# Patient Record
Sex: Male | Born: 1965 | Race: White | Hispanic: No | Smoking: Never smoker
Health system: Southern US, Community
[De-identification: ages and names within clinical notes are randomized; demographics above are authoritative.]

## PROBLEM LIST (undated history)

## (undated) DIAGNOSIS — Z87442 Personal history of urinary calculi: Secondary | ICD-10-CM

## (undated) DIAGNOSIS — N529 Male erectile dysfunction, unspecified: Secondary | ICD-10-CM

## (undated) DIAGNOSIS — N400 Enlarged prostate without lower urinary tract symptoms: Secondary | ICD-10-CM

## (undated) DIAGNOSIS — K4090 Unilateral inguinal hernia, without obstruction or gangrene, not specified as recurrent: Secondary | ICD-10-CM

## (undated) DIAGNOSIS — E785 Hyperlipidemia, unspecified: Secondary | ICD-10-CM

## (undated) DIAGNOSIS — R972 Elevated prostate specific antigen [PSA]: Secondary | ICD-10-CM

## (undated) DIAGNOSIS — I10 Essential (primary) hypertension: Secondary | ICD-10-CM

## (undated) HISTORY — DX: Benign prostatic hyperplasia without lower urinary tract symptoms: N40.0

## (undated) HISTORY — DX: Hyperlipidemia, unspecified: E78.5

## (undated) HISTORY — PX: OTHER SURGICAL HISTORY: SHX169

## (undated) HISTORY — DX: Male erectile dysfunction, unspecified: N52.9

## (undated) HISTORY — DX: Elevated prostate specific antigen (PSA): R97.20

## (undated) HISTORY — DX: Essential (primary) hypertension: I10

---

## 2000-06-03 DIAGNOSIS — Z9889 Other specified postprocedural states: Secondary | ICD-10-CM

## 2000-06-03 DIAGNOSIS — R112 Nausea with vomiting, unspecified: Secondary | ICD-10-CM

## 2000-06-03 HISTORY — DX: Nausea with vomiting, unspecified: R11.2

## 2000-06-03 HISTORY — DX: Other specified postprocedural states: Z98.890

## 2000-06-03 HISTORY — PX: HERNIA REPAIR: SHX51

## 2008-09-04 ENCOUNTER — Emergency Department: Payer: Self-pay | Admitting: Emergency Medicine

## 2008-09-04 ENCOUNTER — Observation Stay: Payer: Self-pay | Admitting: Internal Medicine

## 2008-09-05 ENCOUNTER — Ambulatory Visit: Payer: Self-pay | Admitting: Cardiology

## 2009-02-15 ENCOUNTER — Ambulatory Visit: Payer: Self-pay | Admitting: Family Medicine

## 2009-12-15 ENCOUNTER — Ambulatory Visit: Payer: Self-pay | Admitting: Unknown Physician Specialty

## 2014-01-14 ENCOUNTER — Ambulatory Visit: Payer: Self-pay | Admitting: Podiatry

## 2014-11-09 DIAGNOSIS — E7800 Pure hypercholesterolemia, unspecified: Secondary | ICD-10-CM | POA: Insufficient documentation

## 2014-11-09 DIAGNOSIS — N529 Male erectile dysfunction, unspecified: Secondary | ICD-10-CM | POA: Insufficient documentation

## 2014-11-09 DIAGNOSIS — I10 Essential (primary) hypertension: Secondary | ICD-10-CM | POA: Insufficient documentation

## 2015-05-11 ENCOUNTER — Ambulatory Visit (INDEPENDENT_AMBULATORY_CARE_PROVIDER_SITE_OTHER): Payer: Managed Care, Other (non HMO) | Admitting: Podiatry

## 2015-05-11 ENCOUNTER — Encounter: Payer: Self-pay | Admitting: Podiatry

## 2015-05-11 ENCOUNTER — Ambulatory Visit (INDEPENDENT_AMBULATORY_CARE_PROVIDER_SITE_OTHER): Payer: Managed Care, Other (non HMO)

## 2015-05-11 VITALS — BP 146/85 | HR 62 | Resp 18

## 2015-05-11 DIAGNOSIS — M79672 Pain in left foot: Secondary | ICD-10-CM | POA: Diagnosis not present

## 2015-05-11 DIAGNOSIS — M8430XA Stress fracture, unspecified site, initial encounter for fracture: Secondary | ICD-10-CM

## 2015-05-11 DIAGNOSIS — M722 Plantar fascial fibromatosis: Secondary | ICD-10-CM | POA: Diagnosis not present

## 2015-05-11 MED ORDER — TRAMADOL HCL 50 MG PO TABS: 50.0000 mg | ORAL_TABLET | Freq: Three times a day (TID) | ORAL | Status: DC | PRN

## 2015-05-11 NOTE — Progress Notes (Signed)
   Subjective:    Patient ID: Jeff Rice, male    DOB: [birth year], 49 y.o.   MRN: [id number]  HPI  49 year old male presents the also concerned of sudden onset left heel pain which started on Monday morning. He states the areas sore and tender to the pressure in his heel and even when he sits down without putting weight down. He has had some swelling to his heel but and no redness. No recent injury or trauma. He is not increase her changes activity recently. He does walk/run on a treadmill. No tenderness. No other complaints at this time.  Review of Systems  All other systems reviewed and are negative.      Objective:   Physical Exam General: AAO x3, NAD  Dermatological: Skin is warm, dry and supple bilateral. Nails x 10 are well manicured; remaining integument appears unremarkable at this time. There are no open sores, no preulcerative lesions, no rash or signs of infection present.  Vascular: Dorsalis Pedis artery and Posterior Tibial artery pedal pulses are 2/4 bilateral with immedate capillary fill time. Pedal hair growth present. No varicosities and no lower extremity edema present bilateral. There is no pain with calf compression, swelling, warmth, erythema.   Neruologic: Grossly intact via light touch bilateral. Vibratory intact via tuning fork bilateral. Protective threshold with Semmes Wienstein monofilament intact to all pedal sites bilateral. Patellar and Achilles deep tendon reflexes 2+ bilateral. No Babinski or clonus noted bilateral.   Musculoskeletal: There is tenderness palpation along the posterior, lateral aspect of the left calcaneus. There is very mild discomfort on the plantar aspect of the calcaneus or the insertion of the plantar fascia and along the course of plantar fascia. The plantar fascia appears to be intact. There is no pain on the course last insertion of the Achilles tendon intact and has a negative. There does appear to be pain with lateral compression of  the calcaneus. No other areas tenderness bilateral lower chemise.  Muscular strength 5/5 in all groups tested bilateral.  Gait: Unassisted, Nonantalgic.       Assessment & Plan:  49 year old male left calcaneal stress fracture -Treatment options discussed including all alternatives, risks, and complications -X-rays were obtained and reviewed with the patient. There is a sclerotic line through the calcaneal tubercle concerning for stress fracture. -Due to x-ray changes as well as clinical findings recommend immobilization in a CAM boot. I also gave a prescription for crutches. Dispensed CAM boot. -Ice and elevation -He has been taken anti-inflammatories without any relief. Prescribed tramadol. -Hold off on any exercising. -Follow-up in 2-3 weeks or sooner if any problems arise. In the meantime, encouraged to call the office with any questions, concerns, change in symptoms.   Celesta Gentile, DPM

## 2015-05-11 NOTE — Patient Instructions (Signed)
Stress Fracture Stress fracture is a small break or crack in a bone. A stress fracture can be fully broken (complete) or partially broken (incomplete). The most common sites for stress fractures are the bones in the front of your feet (metatarsals), your heels (calcaneus), and the long bone of your lower leg (tibia). CAUSES A stress fracture is caused by overuse or repetitive exercise, such as running. It happens when a bone cannot absorb any more shock because the muscles around it are weak. Stress fractures happen most commonly when:  You rapidly increase or start a new physical activity.  You use shoes that are worn out or do not fit you properly.  You exercise on a new surface. RISK FACTORS You may be at higher risk for this type of fracture if:  You have a condition that causes weak bones (osteoporosis).  You are male. Stress fractures are more likely to occur in women. SIGNS AND SYMPTOMS The most common symptom of a stress fracture is feeling pain when you are using the affected part of your body. The pain usually goes away when you are resting. Other symptoms may include:  Swelling of the affected area.  Pain in the area when it is touched.  Decreased pain while resting. Stress fracture pain usually develops over time. DIAGNOSIS Diagnosis may include:   Medical history and physical exam.  X-rays.  Bone scan.  MRI. TREATMENT Treatment depends on the severity of your stress fracture. Treatment usually involves resting, icing, compression, and elevation (RICE) of the affected part of your body. Treatment may also include:  Medicines to reduce inflammation.  A cast or a walking shoe.  Crutches.  Surgery. HOME CARE INSTRUCTIONS If You Have a Cast:  Do not stick anything inside the cast to scratch your skin. Doing that increases your risk of infection.  Check the skin around the cast every day. Report any concerns to your health care provider. You may put lotion  on dry skin around the edges of the cast. Do not apply lotion to the skin underneath the cast.  Keep the cast clean and dry.  Cover the cast with a watertight plastic bag to protect it from water while you take a bath or a shower. Do not let the cast get wet.  Do not put pressure on any part of the cast until it is fully hardened. This may take several hours. If You Have a Walking Shoe:  Wear it as directed by your health care provider. Managing Pain, Stiffness, and Swelling  If directed, apply ice to the injured area:  Put ice in a plastic bag.  Place a towel between your skin and the bag.  Leave the ice on for 20 minutes, 2-3 times per day.  Move your fingers or toes often to avoid stiffness and to lessen swelling.  Raise the injured area above the level of your heart while you are sitting or lying down. Activity  Rest as directed by your health care provider. Ask your health care provider if you may do alternative exercises, such as swimming or biking, while you are healing.  Return to your normal activities as directed by your health care provider. Ask your health care provider what activities are safe for you.  Perform range-of-motion exercises only as directed by your health care provider. Safety  Do not use the injured limb to support yourbody weight until your health care provider says that you can. Use crutches if your health care provider tells you to   do so. General Instructions  Do not use any tobacco products, including cigarettes, chewing tobacco, or electronic cigarettes. Tobacco can delay bone healing. If you need help quitting, ask your health care provider.  Take medicines only as directed by your health care provider.  Keep all follow-up visits as directed by your health care provider. This is important. PREVENTION  Only wear shoes that:  Fit well.  Are not worn out.  Eat a healthy diet that contains vitamin D and calcium. This helps keeps your bones  strong.  Be careful when you start a new physical activity. Give your body time to adjust.  Avoid doing only one kind of activity. Do different exercises, such as swimming and running, so that no single part of your body gets overused.  Do strength-training exercises. SEEK MEDICAL CARE IF:  Your pain gets worse.  You have new symptoms.  You have increased swelling. SEEK IMMEDIATE MEDICAL CARE IF:   You lose feeling in the affected area.   This information is not intended to replace advice given to you by your health care provider. Make sure you discuss any questions you have with your health care provider.   Document Released: 08/10/2002 Document Revised: 06/10/2014 Document Reviewed: 12/23/2013 Elsevier Interactive Patient Education 2016 [employer]  

## 2015-05-12 ENCOUNTER — Telehealth: Payer: Self-pay | Admitting: *Deleted

## 2015-05-12 NOTE — Telephone Encounter (Signed)
Pt states saw Dr. Jacqualyn Posey yesterday for stress fracture of ankle, and stood last night in the boot at a Christmas party.  Pt states later last night the ankle felt better, but the top of his foot was killing him.  I told pt that 1 of our doctors showed me if you crossed the 2nd and 3rd strap from the toes it would relieve pressure across the top of the foot, to experiment with the amount of air in the boot.  Pt states no air was put in his boot yesterday, so I told him to pump up the boot 20 or less pumps while standing in the boot, to rest. I told pt to call with concerns.  Pt states understanding.

## 2015-05-13 ENCOUNTER — Encounter: Payer: Self-pay | Admitting: Podiatry

## 2015-05-13 DIAGNOSIS — M8430XA Stress fracture, unspecified site, initial encounter for fracture: Secondary | ICD-10-CM | POA: Insufficient documentation

## 2015-05-13 DIAGNOSIS — M79673 Pain in unspecified foot: Secondary | ICD-10-CM | POA: Insufficient documentation

## 2015-05-16 ENCOUNTER — Telehealth: Payer: Self-pay | Admitting: *Deleted

## 2015-05-16 DIAGNOSIS — M79605 Pain in left leg: Secondary | ICD-10-CM

## 2015-05-16 NOTE — Telephone Encounter (Addendum)
Pt states the knee on the leg that has the cam walker boot is swollen and stiff.  Left message informing pt that most likely he was having to be too active and carry that heavy boot around and change of gait all of these may cause knee swelling, but I would like to discuss other symptoms.  I spoke with pt, he denies any calf pain, redness or swelling, pt states the pain and swelling is in the anterior left knee, feels as if the knee has been hyperextended.  I told pt to ice the area, rest, and I would call tomorrow with further instructions.  Pt states he is going to take Ibuprofen and I agreed that would be a good idea. Pt states the left heel is actually better.  05/17/2015 - Informed pt of Dr. Leigh Aurora recommendation, pt states he is a little better, but the left knee is still stiff and difficult to bend.  I told pt to let me know if he wanted the short boot, and I would inform the [place] office to assist him.  Dr. Jacqualyn Posey presented to [facility] after scheduled surgeries and referred pt to [facility] in [place] or [facility], due to pt's travel schedule, inform pt he can choose who ever can get him in the quickest.  Informed pt.  Faxed pt data to [facility] central scheduling with note to make appt in [place] office [phone], and [place] Arenas [phone].  05/18/2015 - Pt states he received a call from Charyl Dancer, he has an appt today at 930am.

## 2015-05-16 NOTE — Telephone Encounter (Signed)
Continue NSAIDS if able, ice, can try a short boot if they have a tall one

## 2015-06-06 ENCOUNTER — Ambulatory Visit: Payer: Managed Care, Other (non HMO) | Admitting: Podiatry

## 2015-09-07 ENCOUNTER — Other Ambulatory Visit: Payer: Self-pay

## 2015-09-07 DIAGNOSIS — R972 Elevated prostate specific antigen [PSA]: Secondary | ICD-10-CM

## 2015-09-08 ENCOUNTER — Other Ambulatory Visit: Payer: Managed Care, Other (non HMO)

## 2015-09-08 DIAGNOSIS — R972 Elevated prostate specific antigen [PSA]: Secondary | ICD-10-CM

## 2015-09-09 LAB — PSA: PROSTATE SPECIFIC AG, SERUM: 2.7 ng/mL (ref 0.0–4.0)

## 2015-09-13 ENCOUNTER — Encounter: Payer: Self-pay | Admitting: *Deleted

## 2015-09-14 ENCOUNTER — Ambulatory Visit (INDEPENDENT_AMBULATORY_CARE_PROVIDER_SITE_OTHER): Payer: Managed Care, Other (non HMO) | Admitting: Urology

## 2015-09-14 ENCOUNTER — Encounter: Payer: Self-pay | Admitting: Urology

## 2015-09-14 VITALS — BP 151/78 | HR 65 | Ht 68.0 in | Wt 241.0 lb

## 2015-09-14 DIAGNOSIS — R0789 Other chest pain: Secondary | ICD-10-CM | POA: Insufficient documentation

## 2015-09-14 DIAGNOSIS — R972 Elevated prostate specific antigen [PSA]: Secondary | ICD-10-CM

## 2015-09-14 DIAGNOSIS — I10 Essential (primary) hypertension: Secondary | ICD-10-CM | POA: Insufficient documentation

## 2015-09-14 NOTE — Progress Notes (Signed)
09/14/2015 9:16 AM   Jeff Rice 1966-05-19 [id number]  Referring provider: Glendon Axe, MD [facility] [place], [place] [address]  Chief Complaint  Patient presents with  . Elevated PSA    1year    HPI: The patient is a 50 year old gentleman who presents today for follow-up of elevated PSA. His current PSA in April 2017 is 2.7. It was 2.6 in April 2016. Part of the bladder was 2.9 in September 2015 and 3.5 in August 2015. The patient has done well over the last year and has no complaints.    PMH: Past Medical History  Diagnosis Date  . Hypertension   . HLD (hyperlipidemia)   . ED (erectile dysfunction)   . Rising PSA level   . BPH (benign prostatic hyperplasia)     Surgical History: Past Surgical History  Procedure Laterality Date  . Hernia repair  2002  . Salivary gland excision      Home Medications:    Medication List       This list is accurate as of: 09/14/15  9:16 AM.  Always use your most recent med list.               ibuprofen 200 MG tablet  Commonly known as:  ADVIL,MOTRIN  Take 200 mg by mouth every 6 (six) hours as needed.     lisinopril-hydrochlorothiazide 20-25 MG tablet  Commonly known as:  PRINZIDE,ZESTORETIC  TK 1 T PO QD     MITIGARE 0.6 MG Caps  Generic drug:  Colchicine  TAKE 1 TABLET BY MOUTH TWICE A DAY AS NEEDED FOR ACUTE GOUT PAIN     MULTI-VITAMINS Tabs  Take by mouth.     sildenafil 100 MG tablet  Commonly known as:  VIAGRA  Take by mouth.        Allergies: No Known Allergies  Family History: Family History  Problem Relation Age of Onset  . Benign prostatic hyperplasia Father   . Throat cancer Paternal Grandfather   . Prostate cancer Paternal Grandfather   . Heart disease Father   . Heart disease Brother   . Diabetes Mellitus II Father   . Colon cancer Father   . Bladder Cancer Neg Hx   . Kidney cancer Neg Hx     Social History:  reports that he has never smoked. He has  never used smokeless tobacco. He reports that he does not drink alcohol or use illicit drugs.  ROS: UROLOGY Frequent Urination?: No Hard to postpone urination?: No Burning/pain with urination?: No Get up at night to urinate?: No Leakage of urine?: No Urine stream starts and stops?: No Trouble starting stream?: No Do you have to strain to urinate?: No Blood in urine?: No Urinary tract infection?: No Sexually transmitted disease?: No Injury to kidneys or bladder?: No Painful intercourse?: No Weak stream?: No Erection problems?: Yes Penile pain?: No  Gastrointestinal Nausea?: No Vomiting?: No Indigestion/heartburn?: No Diarrhea?: No Constipation?: No  Constitutional Fever: No Night sweats?: No Weight loss?: No Fatigue?: No  Skin Skin rash/lesions?: No Itching?: No  Eyes Blurred vision?: No Double vision?: No  Ears/Nose/Throat Sore throat?: No Sinus problems?: No  Hematologic/Lymphatic Swollen glands?: No Easy bruising?: No  Cardiovascular Leg swelling?: No Chest pain?: No  Respiratory Cough?: No Shortness of breath?: No  Endocrine Excessive thirst?: No  Musculoskeletal Back pain?: No Joint pain?: No  Neurological Headaches?: No Dizziness?: No  Psychologic Depression?: No Anxiety?: No  Physical Exam: BP 151/78 mmHg  Pulse  65  Ht 5\' 8"  (1.727 m)  Wt 241 lb (109.317 kg)  BMI 36.65 kg/m2  Constitutional:  Alert and oriented, No acute distress. HEENT: [place] AT, moist mucus membranes.  Trachea midline, no masses. Cardiovascular: No clubbing, cyanosis, or edema. Respiratory: Normal respiratory effort, no increased work of breathing. GI: Abdomen is soft, nontender, nondistended, no abdominal masses GU: No CVA tenderness. DRE: 1+, smooth. Skin: No rashes, bruises or suspicious lesions. Lymph: No cervical or inguinal adenopathy. Neurologic: Grossly intact, no focal deficits, moving all 4 extremities. Psychiatric: Normal mood and  affect.  Laboratory Data: No results found for: WBC, HGB, HCT, MCV, PLT  No results found for: CREATININE  No results found for: PSA  No results found for: TESTOSTERONE  No results found for: HGBA1C  Urinalysis No results found for: COLORURINE, APPEARANCEUR, LABSPEC, PHURINE, GLUCOSEU, HGBUR, BILIRUBINUR, KETONESUR, PROTEINUR, UROBILINOGEN, NITRITE, LEUKOCYTESUR   Assessment & Plan:    1. History of Elevated PSA -Currently stable. The patient will follow-up in one year with a PSA prior.   Return in about 1 year (around 09/13/2016) for with psa one week prior.  Nickie Retort, MD  [facility] [address], [place], [place] [address] [phone]

## 2015-09-15 ENCOUNTER — Ambulatory Visit: Payer: Self-pay | Admitting: Urology

## 2015-09-17 ENCOUNTER — Encounter: Payer: Self-pay | Admitting: *Deleted

## 2015-09-17 DIAGNOSIS — R002 Palpitations: Secondary | ICD-10-CM | POA: Diagnosis present

## 2015-09-17 DIAGNOSIS — I10 Essential (primary) hypertension: Secondary | ICD-10-CM | POA: Insufficient documentation

## 2015-09-17 DIAGNOSIS — Z791 Long term (current) use of non-steroidal anti-inflammatories (NSAID): Secondary | ICD-10-CM | POA: Diagnosis not present

## 2015-09-17 DIAGNOSIS — E785 Hyperlipidemia, unspecified: Secondary | ICD-10-CM | POA: Diagnosis not present

## 2015-09-17 NOTE — ED Notes (Signed)
Pt presents w/ c/o palpitations, "fluttering" sensation in chest. Hx of same. Pt denies other associated sxs. Pt in no acute distress at this time.

## 2015-09-18 ENCOUNTER — Emergency Department: Payer: Managed Care, Other (non HMO)

## 2015-09-18 ENCOUNTER — Emergency Department
Admission: EM | Admit: 2015-09-18 | Discharge: 2015-09-18 | Disposition: A | Discharge status: Home / Self Care | Payer: Managed Care, Other (non HMO) | Attending: Emergency Medicine | Admitting: Emergency Medicine

## 2015-09-18 DIAGNOSIS — R002 Palpitations: Secondary | ICD-10-CM

## 2015-09-18 LAB — MAGNESIUM: MAGNESIUM: 2 mg/dL (ref 1.7–2.4)

## 2015-09-18 LAB — BASIC METABOLIC PANEL
ANION GAP: 4 — AB (ref 5–15)
BUN: 21 mg/dL — ABNORMAL HIGH (ref 6–20)
CALCIUM: 8.9 mg/dL (ref 8.9–10.3)
CO2: 29 mmol/L (ref 22–32)
Chloride: 103 mmol/L (ref 101–111)
Creatinine, Ser: 1.08 mg/dL (ref 0.61–1.24)
Glucose, Bld: 101 mg/dL — ABNORMAL HIGH (ref 65–99)
Potassium: 3.3 mmol/L — ABNORMAL LOW (ref 3.5–5.1)
SODIUM: 136 mmol/L (ref 135–145)

## 2015-09-18 LAB — TROPONIN I

## 2015-09-18 LAB — CBC
HCT: 42.9 % (ref 40.0–52.0)
HEMOGLOBIN: 15.3 g/dL (ref 13.0–18.0)
MCH: 33.3 pg (ref 26.0–34.0)
MCHC: 35.6 g/dL (ref 32.0–36.0)
MCV: 93.6 fL (ref 80.0–100.0)
PLATELETS: 216 10*3/uL (ref 150–440)
RBC: 4.58 MIL/uL (ref 4.40–5.90)
RDW: 12.4 % (ref 11.5–14.5)
WBC: 9.9 10*3/uL (ref 3.8–10.6)

## 2015-09-18 LAB — TSH: TSH: 3.486 u[IU]/mL (ref 0.350–4.500)

## 2015-09-18 MED ORDER — MAGNESIUM SULFATE 2 GM/50ML IV SOLN
2.0000 g | Freq: Once | INTRAVENOUS | Status: AC
  Administered 2015-09-18: 2 g via INTRAVENOUS
  Filled 2015-09-18: qty 50

## 2015-09-18 NOTE — ED Notes (Signed)
Pt discharged to home.  Family member driving.  Discharge instructions reviewed.  Verbalized understanding.  No questions or concerns at this time.  Teach back verified.  Pt in NAD.  No items left in ED.   

## 2015-09-18 NOTE — ED Provider Notes (Signed)
[facility] Emergency Department Provider Note  ____________________________________________  Time seen: Approximately 318 AM  I have reviewed the triage vital signs and the nursing notes.   HISTORY  Chief Complaint Palpitations    HPI Jeff Rice is a 50 y.o. male who comes into the hospital today with heart palpitations. The patient reports it started before 11 PM when he was getting ready to go to sleep. He felt fluttering in his chest and his heart rate seemed to go down into the 40s. The patient is wearing a thick the watch and he was monitoring his heart rate with that. The patient became concerned so he decided to come into the hospital. The patient has had palpitations in the past and reports that he has been seen by Dr. Satira Mccallum. He reports that on his last EKG they noticed the palpitations but he was told that there was not a real concern about them. He was just told that sometimes he may feel numb. The palpitations seem more prevalent tonight. The patient reports that he did have caffeine this weekend which is not his norm. He denies any chest pain denies any shortness of breath or dizziness no lightheadedness. He still occasionally feels the symptoms but he reports that they're not as bad. The patient is also been taking some pain medicine for tendinitis but he does not remove the name of it. The patient is here for further evaluation.   Past Medical History  Diagnosis Date  . Hypertension   . HLD (hyperlipidemia)   . ED (erectile dysfunction)   . Rising PSA level   . BPH (benign prostatic hyperplasia)     Patient Active Problem List   Diagnosis Date Noted  . Atypical chest pain 09/14/2015  . BP (high blood pressure) 09/14/2015  . Stress fracture 05/13/2015  . Heel pain 05/13/2015  . Benign essential HTN 11/09/2014  . ED (erectile dysfunction) of organic origin 11/09/2014  . Pure hypercholesterolemia 11/09/2014    Past Surgical History   Procedure Laterality Date  . Hernia repair  2002  . Salivary gland excision      Current Outpatient Rx  Name  Route  Sig  Dispense  Refill  . ibuprofen (ADVIL,MOTRIN) 200 MG tablet   Oral   Take 200 mg by mouth every 6 (six) hours as needed.         Marland Kitchen lisinopril-hydrochlorothiazide (PRINZIDE,ZESTORETIC) 20-25 MG tablet      TK 1 T PO QD      2   . MITIGARE 0.6 MG CAPS      TAKE 1 TABLET BY MOUTH TWICE A DAY AS NEEDED FOR ACUTE GOUT PAIN      0     Dispense as written.   . Multiple Vitamin (MULTI-VITAMINS) TABS   Oral   Take by mouth.         . sildenafil (VIAGRA) 100 MG tablet   Oral   Take by mouth.           Allergies Review of patient's allergies indicates no known allergies.  Family History  Problem Relation Age of Onset  . Benign prostatic hyperplasia Father   . Throat cancer Paternal Grandfather   . Prostate cancer Paternal Grandfather   . Heart disease Father   . Heart disease Brother   . Diabetes Mellitus II Father   . Colon cancer Father   . Bladder Cancer Neg Hx   . Kidney cancer Neg Hx     Social History Social  History  Substance Use Topics  . Smoking status: Never Smoker   . Smokeless tobacco: Never Used  . Alcohol Use: No    Review of Systems Constitutional: No fever/chills Eyes: No visual changes. ENT: No sore throat. Cardiovascular: Palpitations Respiratory: Denies shortness of breath. Gastrointestinal: No abdominal pain.  No nausea, no vomiting.  No diarrhea.  No constipation. Genitourinary: Negative for dysuria. Musculoskeletal: Negative for back pain. Skin: Negative for rash. Neurological: Negative for headaches, focal weakness or numbness.  10-point ROS otherwise negative.  ____________________________________________   PHYSICAL EXAM:  VITAL SIGNS: ED Triage Vitals  Enc Vitals Group     BP 09/17/15 2356 143/66 mmHg     Pulse Rate 09/17/15 2356 41     Resp 09/17/15 2356 20     Temp 09/17/15 2356 98.2 F  (36.8 C)     Temp Source 09/17/15 2356 Oral     SpO2 09/17/15 2356 97 %     Weight 09/17/15 2356 237 lb (107.502 kg)     Height 09/17/15 2356 5\' 8"  (1.727 m)     Head Cir --      Peak Flow --      Pain Score 09/17/15 2352 0     Pain Loc --      Pain Edu? --      Excl. in [place]? --     Constitutional: Alert and oriented. Well appearing and in no acute distress. Eyes: Conjunctivae are normal. PERRL. EOMI. Head: Atraumatic. Nose: No congestion/rhinnorhea. Mouth/Throat: Mucous membranes are moist.  Oropharynx non-erythematous. Cardiovascular: Normal rate, regular rhythm. Grossly normal heart sounds.  Good peripheral circulation.  Respiratory: Normal respiratory effort.  No retractions. Lungs CTAB. Gastrointestinal: Soft and nontender. No distention. Positive bowel sounds Musculoskeletal: No lower extremity tenderness nor edema.   Neurologic:  Normal speech and language.  Skin:  Skin is warm, dry and intact.  Psychiatric: Mood and affect are normal.   ____________________________________________   LABS (all labs ordered are listed, but only abnormal results are displayed)  Labs Reviewed  BASIC METABOLIC PANEL - Abnormal; Notable for the following:    Potassium 3.3 (*)    Glucose, Bld 101 (*)    BUN 21 (*)    Anion gap 4 (*)    All other components within normal limits  CBC  TROPONIN I  TSH  MAGNESIUM  TROPONIN I   ____________________________________________  EKG  ED ECG REPORT I, Loney Hering, the attending physician, personally viewed and interpreted this ECG.   Date: 09/17/2015  EKG Time: 2350  Rate: 100  Rhythm: normal sinus rhythm, frequent PVC's noted  Axis: normal  Intervals:right bundle branch block  ST&T Change: normal  ____________________________________________  RADIOLOGY  CXR: No acute pulmonary process ____________________________________________   PROCEDURES  Procedure(s) performed: None  Critical Care performed:  No  ____________________________________________   INITIAL IMPRESSION / ASSESSMENT AND PLAN / ED COURSE  Pertinent labs & imaging results that were available during my care of the patient were reviewed by me and considered in my medical decision making (see chart for details).  This is a 50 year old male who comes into the hospital today with some palpitations. The patient reports that he's had them before and he did drink some caffeine this weekend that he was not used to drinking. I did give the patient a dose of magnesium sulfate 2 g IV 1 dose and he reports that it did help with the symptoms. The patient otherwise has no other complaints or concerns. His 2  troponins were negative and I will discharge him to follow-up with Dr. Satira Mccallum. Otherwise the patient has no further questions and he'll be discharged home. ____________________________________________   FINAL CLINICAL IMPRESSION(S) / ED DIAGNOSES  Final diagnoses:  Palpitations      Loney Hering, MD 09/18/15 [phone]

## 2015-09-18 NOTE — Discharge Instructions (Signed)

## 2015-09-19 ENCOUNTER — Telehealth: Payer: Self-pay

## 2015-09-19 NOTE — Telephone Encounter (Signed)
-----   Message from Nori Riis, PA-C sent at 09/18/2015  1:14 PM EDT ----- PSA is stable.  He needs to return in 1 year.

## 2015-09-19 NOTE — Telephone Encounter (Signed)
LMOM- most recent labs are stable and well see in 1 year.

## 2016-06-13 ENCOUNTER — Other Ambulatory Visit: Payer: Self-pay

## 2016-06-17 ENCOUNTER — Other Ambulatory Visit: Payer: Managed Care, Other (non HMO)

## 2016-06-17 DIAGNOSIS — R972 Elevated prostate specific antigen [PSA]: Secondary | ICD-10-CM

## 2016-06-18 LAB — PSA TOTAL (REFLEX TO FREE): Prostate Specific Ag, Serum: 8.1 ng/mL — ABNORMAL HIGH (ref 0.0–4.0)

## 2016-06-18 LAB — FPSA% REFLEX
% FREE PSA: 32.2 %
PSA, FREE: 2.61 ng/mL

## 2016-06-20 ENCOUNTER — Ambulatory Visit: Payer: Managed Care, Other (non HMO)

## 2016-07-05 ENCOUNTER — Ambulatory Visit (INDEPENDENT_AMBULATORY_CARE_PROVIDER_SITE_OTHER): Payer: Managed Care, Other (non HMO) | Admitting: Urology

## 2016-07-05 ENCOUNTER — Encounter: Payer: Self-pay | Admitting: Urology

## 2016-07-05 VITALS — BP 163/86 | HR 76 | Ht 68.0 in | Wt 229.0 lb

## 2016-07-05 DIAGNOSIS — N401 Enlarged prostate with lower urinary tract symptoms: Secondary | ICD-10-CM

## 2016-07-05 DIAGNOSIS — R350 Frequency of micturition: Secondary | ICD-10-CM

## 2016-07-05 DIAGNOSIS — R972 Elevated prostate specific antigen [PSA]: Secondary | ICD-10-CM | POA: Diagnosis not present

## 2016-07-05 DIAGNOSIS — N529 Male erectile dysfunction, unspecified: Secondary | ICD-10-CM | POA: Diagnosis not present

## 2016-07-05 NOTE — Progress Notes (Signed)
07/05/2016 1:57 PM   [name] 06-10-1965 [id number]  Referring provider: Glendon Axe, MD [facility] [place], [place] [address]  Chief Complaint  Patient presents with  . Elevated PSA    last seen 09/2015 wants to discuss next steps    HPI: 51 yo M who returns to the office today for annual follow-up for history of elevated PSA, BPH, and ED.   Elevated PSA History of fluctuating PSA.  Trend as below.  Most recently, rise to 8.1, reflex to free 32% free on 06/17/16.    + family history of prostate cancer in grandfather (did not die form prostate cancer), dad had PSA issues    He is an avid bike rider and has been riding a lot recently.    PSA trend: 8.1  1/18 2.7  4/17 2.6  4/16 2.9  9/15 3.5  8/15  BPH He does have urinary frequency (drinks a lot of water) but no changes from baseline.  No dysuria or gross hematuria.  No nocturia.  Takes no BPH meds.    ED Using Viagra 100 mg as needed. No side effects.  PMH: Past Medical History:  Diagnosis Date  . BPH (benign prostatic hyperplasia)   . ED (erectile dysfunction)   . HLD (hyperlipidemia)   . Hypertension   . Rising PSA level     Surgical History: Past Surgical History:  Procedure Laterality Date  . HERNIA REPAIR  2002  . salivary gland excision      Home Medications:  Allergies as of 07/05/2016   No Known Allergies     Medication List       Accurate as of 07/05/16 11:59 PM. Always use your most recent med list.          allopurinol 100 MG tablet Commonly known as:  ZYLOPRIM Take by mouth.   Cholecalciferol 4000 units Caps Take by mouth.   ibuprofen 200 MG tablet Commonly known as:  ADVIL,MOTRIN Take 200 mg by mouth every 6 (six) hours as needed.   lisinopril-hydrochlorothiazide 20-25 MG tablet Commonly known as:  PRINZIDE,ZESTORETIC TK 1 T PO QD   MITIGARE 0.6 MG Caps Generic drug:  Colchicine TAKE 1 TABLET BY MOUTH TWICE A DAY AS NEEDED FOR ACUTE  GOUT PAIN   MULTI-VITAMINS Tabs Take by mouth.   PSYLLIUM HUSK PO Take by mouth.   sildenafil 100 MG tablet Commonly known as:  VIAGRA Take by mouth.       Allergies: No Known Allergies  Family History: Family History  Problem Relation Age of Onset  . Benign prostatic hyperplasia Father   . Heart disease Father   . Diabetes Mellitus II Father   . Colon cancer Father   . Throat cancer Paternal Grandfather   . Prostate cancer Paternal Grandfather   . Heart disease Brother   . Bladder Cancer Neg Hx   . Kidney cancer Neg Hx     Social History:  reports that he has never smoked. He has never used smokeless tobacco. He reports that he does not drink alcohol or use drugs.  ROS: UROLOGY Frequent Urination?: No Hard to postpone urination?: No Burning/pain with urination?: No Get up at night to urinate?: No Leakage of urine?: No Urine stream starts and stops?: No Trouble starting stream?: No Do you have to strain to urinate?: No Blood in urine?: No Urinary tract infection?: No Sexually transmitted disease?: No Injury to kidneys or bladder?: No Painful intercourse?: No Weak stream?: No Erection  problems?: No Penile pain?: No  Gastrointestinal Nausea?: No Vomiting?: No Indigestion/heartburn?: No Diarrhea?: No Constipation?: No  Constitutional Fever: No Night sweats?: No Weight loss?: No Fatigue?: No  Skin Skin rash/lesions?: No Itching?: No  Eyes Blurred vision?: No Double vision?: No  Ears/Nose/Throat Sore throat?: No Sinus problems?: No  Hematologic/Lymphatic Swollen glands?: No Easy bruising?: No  Cardiovascular Leg swelling?: No Chest pain?: No  Respiratory Cough?: No Shortness of breath?: No  Endocrine Excessive thirst?: No  Musculoskeletal Back pain?: No Joint pain?: No  Neurological Headaches?: No Dizziness?: No  Psychologic Depression?: No Anxiety?: No  Physical Exam: BP (!) 163/86   Pulse 76   Ht 5\' 8"  (1.727 m)    Wt 229 lb (103.9 kg)   BMI 34.82 kg/m   Constitutional:  Alert and oriented, No acute distress. HEENT: [place] AT, moist mucus membranes.  Trachea midline, no masses. Cardiovascular: No clubbing, cyanosis, or edema. Respiratory: Normal respiratory effort, no increased work of breathing. GI: Abdomen is soft, nontender, nondistended, no abdominal masses GU: No CVA tenderness.  Rectal exam 4/17 by Dr. Pilar Jarvis unremarkable Skin: No rashes, bruises or suspicious lesions. Neurologic: Grossly intact, no focal deficits, moving all 4 extremities. Psychiatric: Normal mood and affect.  Laboratory Data: Lab Results  Component Value Date   WBC 9.9 09/18/2015   HGB 15.3 09/18/2015   HCT 42.9 09/18/2015   MCV 93.6 09/18/2015   PLT 216 09/18/2015    Lab Results  Component Value Date   CREATININE 1.08 09/18/2015    Component     Latest Ref Rng & Units 09/08/2015 06/17/2016  PSA     0.0 - 4.0 ng/mL 2.7 8.1 (H)  Reflex Criteria       Comment     Pertinent Imaging: n/a  Assessment & Plan:    1. Elevated PSA Fluctuating PSA, recent rapid rise to 8.1 with reassuring free PSA   We reviewed the implications of an elevated PSA and the uncertainty surrounding it. In general, a man's PSA increases with age and is produced by both normal and cancerous prostate tissue. Differential for elevated PSA is BPH, prostate cancer, infection, recent intercourse/ejaculation, prostate infarction, recent urethroscopic manipulation (foley placement/cystoscopy) and prostatitis. Management of an elevated PSA can include observation or prostate biopsy and wediscussed this in detail. We discussed that indications for prostate biopsy are defined by age and race specific PSA cutoffs as well as a PSA velocity of 0.75/year.  Suspect inflammation, recommend repeat in 3 months to ensure this is true value- aggreable with this plan  Options including biopsy, MRI, continued observation with close follow up were discussed as  options   2. Benign prostatic hyperplasia with urinary frequency Frequency related to behavior, minimal bother  3. Erectile dysfunction, unspecified erectile dysfunction type Continue Viagra as needed   Return in about 3 months (around 10/02/2016) for PSA/ DRE in 3 months (lab visit prior to follow up).  Hollice Espy, MD  [facility] [address] [place], [place] [address] [phone]

## 2016-09-06 ENCOUNTER — Other Ambulatory Visit: Payer: Managed Care, Other (non HMO)

## 2016-09-13 ENCOUNTER — Ambulatory Visit: Payer: Managed Care, Other (non HMO)

## 2016-09-13 DIAGNOSIS — R1904 Left lower quadrant abdominal swelling, mass and lump: Secondary | ICD-10-CM | POA: Insufficient documentation

## 2016-09-14 DIAGNOSIS — M1000 Idiopathic gout, unspecified site: Secondary | ICD-10-CM | POA: Insufficient documentation

## 2016-09-14 DIAGNOSIS — R972 Elevated prostate specific antigen [PSA]: Secondary | ICD-10-CM | POA: Insufficient documentation

## 2016-09-14 DIAGNOSIS — M109 Gout, unspecified: Secondary | ICD-10-CM | POA: Insufficient documentation

## 2016-09-19 ENCOUNTER — Other Ambulatory Visit: Payer: Self-pay | Admitting: Internal Medicine

## 2016-09-19 DIAGNOSIS — R1904 Left lower quadrant abdominal swelling, mass and lump: Secondary | ICD-10-CM

## 2016-09-26 ENCOUNTER — Other Ambulatory Visit: Payer: Self-pay

## 2016-09-26 ENCOUNTER — Other Ambulatory Visit: Payer: 59

## 2016-09-26 DIAGNOSIS — R972 Elevated prostate specific antigen [PSA]: Secondary | ICD-10-CM

## 2016-09-27 LAB — PSA: Prostate Specific Ag, Serum: 2.6 ng/mL (ref 0.0–4.0)

## 2016-09-30 ENCOUNTER — Ambulatory Visit
Admission: RE | Admit: 2016-09-30 | Discharge: 2016-09-30 | Disposition: A | Discharge status: Home / Self Care | Payer: 59 | Source: Ambulatory Visit | Attending: Internal Medicine | Admitting: Internal Medicine

## 2016-09-30 ENCOUNTER — Other Ambulatory Visit: Payer: Self-pay | Admitting: Internal Medicine

## 2016-09-30 DIAGNOSIS — K4090 Unilateral inguinal hernia, without obstruction or gangrene, not specified as recurrent: Secondary | ICD-10-CM | POA: Diagnosis not present

## 2016-09-30 DIAGNOSIS — K5730 Diverticulosis of large intestine without perforation or abscess without bleeding: Secondary | ICD-10-CM | POA: Insufficient documentation

## 2016-09-30 DIAGNOSIS — R1904 Left lower quadrant abdominal swelling, mass and lump: Secondary | ICD-10-CM | POA: Diagnosis present

## 2016-09-30 MED ORDER — IOPAMIDOL (ISOVUE-300) INJECTION 61%
100.0000 mL | Freq: Once | INTRAVENOUS | Status: AC | PRN
  Administered 2016-09-30: 100 mL via INTRAVENOUS

## 2016-10-03 ENCOUNTER — Ambulatory Visit (INDEPENDENT_AMBULATORY_CARE_PROVIDER_SITE_OTHER): Payer: 59 | Admitting: Urology

## 2016-10-03 ENCOUNTER — Telehealth: Payer: Self-pay | Admitting: Urology

## 2016-10-03 NOTE — Progress Notes (Signed)
Note opened in error, see phone note

## 2016-10-04 NOTE — Telephone Encounter (Signed)
PSA reviewed with the patient. Backed down to his baseline, 2.6. We will see him next year for repeat PSA/DRE. No intervention at this time.  Hollice Espy, MD

## 2017-09-26 ENCOUNTER — Other Ambulatory Visit: Payer: 59

## 2017-09-26 ENCOUNTER — Other Ambulatory Visit: Payer: Self-pay

## 2017-09-26 DIAGNOSIS — R972 Elevated prostate specific antigen [PSA]: Secondary | ICD-10-CM

## 2017-09-27 LAB — PSA: Prostate Specific Ag, Serum: 3.9 ng/mL (ref 0.0–4.0)

## 2017-10-03 ENCOUNTER — Encounter: Payer: Self-pay | Admitting: Urology

## 2017-10-03 ENCOUNTER — Ambulatory Visit (INDEPENDENT_AMBULATORY_CARE_PROVIDER_SITE_OTHER): Payer: 59 | Admitting: Urology

## 2017-10-03 ENCOUNTER — Ambulatory Visit: Payer: 59 | Admitting: Urology

## 2017-10-03 VITALS — BP 140/66 | HR 67 | Ht 68.0 in | Wt 228.0 lb

## 2017-10-03 DIAGNOSIS — R972 Elevated prostate specific antigen [PSA]: Secondary | ICD-10-CM | POA: Diagnosis not present

## 2017-10-03 DIAGNOSIS — N529 Male erectile dysfunction, unspecified: Secondary | ICD-10-CM | POA: Diagnosis not present

## 2017-10-03 DIAGNOSIS — N401 Enlarged prostate with lower urinary tract symptoms: Secondary | ICD-10-CM | POA: Diagnosis not present

## 2017-10-03 DIAGNOSIS — R350 Frequency of micturition: Secondary | ICD-10-CM

## 2017-10-03 NOTE — Progress Notes (Signed)
10/03/2017 5:31 PM   Jeff Rice Dec 21, 1965 [id number]  Referring provider: Glendon Axe, MD [facility] [place], [place] [address]  Chief Complaint  Patient presents with  . Elevated PSA    13month    HPI: 52 yo M who returns to the office today for annual follow-up for history of elevated PSA, BPH, and ED.   Elevated PSA History of fluctuating PSA, trend below.  + family history of prostate cancer in grandfather (did not die form prostate cancer), dad had PSA issues.  PSA trend: 3.9  4/19 2.6  4/18 8.1  1/18 2.7  4/17 2.6  4/16 2.9  9/15 3.5  8/15  BPH He does have urinary frequency (drinks a lot of water) but no changes from baseline.  No dysuria or gross hematuria.  Occational nocturia if drinks before bed.  Takes no BPH meds.    ED Using Viagra 100 mg as needed. No side effects.    PMH: Past Medical History:  Diagnosis Date  . BPH (benign prostatic hyperplasia)   . ED (erectile dysfunction)   . HLD (hyperlipidemia)   . Hypertension   . Rising PSA level     Surgical History: Past Surgical History:  Procedure Laterality Date  . HERNIA REPAIR  2002  . salivary gland excision      Home Medications:  Allergies as of 10/03/2017   No Known Allergies     Medication List        Accurate as of 10/03/17 11:59 PM. Always use your most recent med list.          allopurinol 100 MG tablet Commonly known as:  ZYLOPRIM Take by mouth.   Cholecalciferol 4000 units Caps Take by mouth.   ibuprofen 200 MG tablet Commonly known as:  ADVIL,MOTRIN Take 200 mg by mouth every 6 (six) hours as needed.   lisinopril-hydrochlorothiazide 20-25 MG tablet Commonly known as:  PRINZIDE,ZESTORETIC TK 1 T PO QD   MITIGARE 0.6 MG Caps Generic drug:  Colchicine TAKE 1 TABLET BY MOUTH TWICE A DAY AS NEEDED FOR ACUTE GOUT PAIN   MULTI-VITAMINS Tabs Take by mouth.   PSYLLIUM HUSK PO Take by mouth.   sildenafil 100 MG  tablet Commonly known as:  VIAGRA Take by mouth.       Allergies: No Known Allergies  Family History: Family History  Problem Relation Age of Onset  . Benign prostatic hyperplasia Father   . Heart disease Father   . Diabetes Mellitus II Father   . Colon cancer Father   . Throat cancer Paternal Grandfather   . Prostate cancer Paternal Grandfather   . Heart disease Brother   . Bladder Cancer Neg Hx   . Kidney cancer Neg Hx     Social History:  reports that he has never smoked. He has never used smokeless tobacco. He reports that he does not drink alcohol or use drugs.  ROS: UROLOGY Frequent Urination?: No Hard to postpone urination?: No Burning/pain with urination?: No Get up at night to urinate?: No Leakage of urine?: No Urine stream starts and stops?: No Trouble starting stream?: No Do you have to strain to urinate?: No Blood in urine?: No Urinary tract infection?: No Sexually transmitted disease?: No Injury to kidneys or bladder?: No Painful intercourse?: No Weak stream?: No Erection problems?: No Penile pain?: No  Gastrointestinal Nausea?: No Vomiting?: No Indigestion/heartburn?: No Diarrhea?: No Constipation?: No  Constitutional Fever: No Night sweats?: No Weight loss?: No Fatigue?: No  Skin Skin rash/lesions?: No Itching?: No  Eyes Blurred vision?: No Double vision?: No  Ears/Nose/Throat Sore throat?: No Sinus problems?: No  Hematologic/Lymphatic Swollen glands?: No Easy bruising?: No  Cardiovascular Leg swelling?: No Chest pain?: No  Respiratory Cough?: No Shortness of breath?: No  Endocrine Excessive thirst?: No  Musculoskeletal Back pain?: No Joint pain?: No  Neurological Headaches?: No Dizziness?: No  Psychologic Depression?: No Anxiety?: No  Physical Exam: BP 140/66   Pulse 67   Ht 5\' 8"  (1.727 m)   Wt 228 lb (103.4 kg)   BMI 34.67 kg/m   Constitutional:  Alert and oriented, No acute distress. HEENT: [place]  AT, moist mucus membranes.  Trachea midline, no masses. Cardiovascular: No clubbing, cyanosis, or edema. Respiratory: Normal respiratory effort, no increased work of breathing. GI: Abdomen is soft, nontender, nondistended, no abdominal masses Rectal: Normal sphincter tone.  No masses, nodules, or lesions.  No significant prostamegaly. Skin: No rashes, bruises or suspicious lesions. Neurologic: Grossly intact, no focal deficits, moving all 4 extremities. Psychiatric: Normal mood and affect.  Laboratory Data: Lab Results  Component Value Date   WBC 9.9 09/18/2015   HGB 15.3 09/18/2015   HCT 42.9 09/18/2015   MCV 93.6 09/18/2015   PLT 216 09/18/2015    Lab Results  Component Value Date   CREATININE 1.08 09/18/2015    PSA trend as above  Urinalysis N/a  Pertinent Imaging: N/a  Assessment & Plan:    1. Elevated PSA History of fluctuating PSA Recent PSA closer to baseline but up from last PSA Suspect may be inflammatory related We will continue to follow with annual PSA/DRE  2. Benign prostatic hyperplasia with urinary frequency Frequency related to behavior, minimal bother  3. Erectile dysfunction, unspecified erectile dysfunction type Continue Viagra as needed  Follow-up in 1 year with PSA prior/ DRE  Hollice Espy, MD  [facility] [address], [place], [place] [address] [phone]

## 2017-10-10 ENCOUNTER — Ambulatory Visit: Payer: 59 | Admitting: Urology

## 2018-10-05 ENCOUNTER — Other Ambulatory Visit: Payer: Self-pay

## 2018-10-05 ENCOUNTER — Other Ambulatory Visit: Payer: 59

## 2018-10-05 ENCOUNTER — Other Ambulatory Visit: Payer: Self-pay | Admitting: Urology

## 2018-10-05 DIAGNOSIS — R972 Elevated prostate specific antigen [PSA]: Secondary | ICD-10-CM

## 2018-10-06 LAB — PSA: Prostate Specific Ag, Serum: 5 ng/mL — ABNORMAL HIGH (ref 0.0–4.0)

## 2018-10-07 ENCOUNTER — Ambulatory Visit: Payer: 59 | Admitting: Urology

## 2018-10-12 ENCOUNTER — Telehealth (INDEPENDENT_AMBULATORY_CARE_PROVIDER_SITE_OTHER): Payer: 59 | Admitting: Urology

## 2018-10-12 ENCOUNTER — Other Ambulatory Visit: Payer: Self-pay

## 2018-10-12 DIAGNOSIS — R972 Elevated prostate specific antigen [PSA]: Secondary | ICD-10-CM | POA: Diagnosis not present

## 2018-10-12 DIAGNOSIS — N529 Male erectile dysfunction, unspecified: Secondary | ICD-10-CM

## 2018-10-12 NOTE — Progress Notes (Signed)
Virtual Visit via Video Note  I connected with Jeff Rice on 10/12/18 at 11:00 AM EDT by a video enabled telemedicine application and verified that I am speaking with the correct person using two identifiers.  Location: Patient: home Provider: home   I discussed the limitations of evaluation and management by telemedicine and the availability of in person appointments. The patient expressed understanding and agreed to proceed.  History of Present Illness: 53 yo Mwho returns to the office today for annual follow-up for history of elevated PSA, BPH, andED via virtual visit.  Elevated PSA History of fluctuating PSA, trend below.   Previous rectal exam last year unremarkable.  + family history of prostate cancer in grandfather (did not die form prostate cancer), dad had PSA issues.  PSA trend: 5.0  10/2018 4.11 6/19 3.9  4/19 2.6  4/18 8.1 1/18 2.7 4/17 2.6 4/16 2.9 9/15 3.5 8/15  BPH No voiding complains today No dysuria or gross hematuria. Occational nocturia if drinks before bed. Takes no BPH meds.   ED Using Viagra 100 mg as needed. No issues or concerns.  Objective:   Pleasant, well appearing  Assessment and Plan:  1. Elevated PSA PSA up again to 5, but lower than previous nadir History of fluctuation PSA Recommend repeat PSA in 4 months with DRE  Discussed ddx as per previous discussion If PSA remains elevated, will consider prostate MR vs. Biopsy (more interested in MRI)  2. Erectile dysfunction, unspecified erectile dysfunction type Stable, using viagra as needed  Follow Up Instructions: F/u 4 months with PSA prior (in person)   I discussed the assessment and treatment plan with the patient. The patient was provided an opportunity to ask questions and all were answered. The patient agreed with the plan and demonstrated an understanding of the instructions.   The patient was advised to call back or seek an in-person evaluation if the  symptoms worsen or if the condition fails to improve as anticipated.  I provided 7 minutes of non-face-to-face time during this encounter.   Hollice Espy, MD

## 2018-10-14 ENCOUNTER — Telehealth: Payer: 59 | Admitting: Urology

## 2019-01-19 NOTE — Progress Notes (Signed)
01/20/2019 10:26 AM   [name] 11/11/1965 [id number]  Referring provider: Glendon Axe, MD [facility] [place],  [place] [address]  Chief Complaint  Patient presents with  . Elevated PSA    HPI: Mr. Speas is a 53 year old male with a history of elevated PSA, BPH with LU TS and ED who presents today for follow up.  History of elevated PSA Component     Latest Ref Rng & Units 09/08/2015 06/17/2016 09/26/2016 09/26/2017  Prostate Specific Ag, Serum     0.0 - 4.0 ng/mL 2.7 8.1 (H) 2.6 3.9   Component     Latest Ref Rng & Units 10/05/2018  Prostate Specific Ag, Serum     0.0 - 4.0 ng/mL 5.0 (H)    BPH WITH LUTS  (prostate and/or bladder) IPSS score: 12/3    PVR: 0 mL      Major complaint(s):  Nocturia x 2-3 ( for years) and intermittency and a weak urinary stream x two week due to allergy medications.  Denies any dysuria, hematuria or suprapubic pain.  He states that he drinks a lot of water.    Denies any recent fevers, chills, nausea or vomiting.  He does not have a family history of prostate cancer.   IPSS    Row Name 01/20/19 0900         International Prostate Symptom Score   How often have you had the sensation of not emptying your bladder?  Not at All     How often have you had to urinate less than every two hours?  Less than half the time     How often have you found you stopped and started again several times when you urinated?  About half the time     How often have you found it difficult to postpone urination?  Not at All     How often have you had a weak urinary stream?  More than half the time     How often have you had to strain to start urination?  Not at All     How many times did you typically get up at night to urinate?  3 Times     Total IPSS Score  12       Quality of Life due to urinary symptoms   If you were to spend the rest of your life with your urinary condition just the way it is now how would you feel  about that?  Mixed        Score:  1-7 Mild 8-19 Moderate 20-35 Severe   Erectile dysfunction His SHIM score is 16, which is mild to moderate ED.   His risk factors for ED are age, BPH, HTN, HLD and CAD.  He denies any painful erections or curvatures with his erections.   He is still having spontaneous erections.  He has tried PDE5i in the past with success.   SHIM    Row Name 01/20/19 0959         SHIM: Over the last 6 months:   How do you rate your confidence that you could get and keep an erection?  Low     When you had erections with sexual stimulation, how often were your erections hard enough for penetration (entering your partner)?  Sometimes (about half the time)     During sexual intercourse, how often were you able to maintain your erection after you had penetrated (entered) your  partner?  Sometimes (about half the time)     During sexual intercourse, how difficult was it to maintain your erection to completion of intercourse?  Slightly Difficult     When you attempted sexual intercourse, how often was it satisfactory for you?  Most Times (much more than half the time)       SHIM Total Score   SHIM  16        Score: 1-7 Severe ED 8-11 Moderate ED 12-16 Mild-Moderate ED 17-21 Mild ED 22-25 No ED  PMH: Past Medical History:  Diagnosis Date  . BPH (benign prostatic hyperplasia)   . ED (erectile dysfunction)   . HLD (hyperlipidemia)   . Hypertension   . Rising PSA level     Surgical History: Past Surgical History:  Procedure Laterality Date  . HERNIA REPAIR  2002  . salivary gland excision      Home Medications:  Allergies as of 01/20/2019   No Known Allergies     Medication List       Accurate as of January 20, 2019 10:26 AM. If you have any questions, ask your nurse or doctor.        allopurinol 100 MG tablet Commonly known as: ZYLOPRIM Take by mouth.   BLACK CURRANT SEED OIL PO   Cholecalciferol 100 MCG (4000 UT) Caps Take by mouth.    ibuprofen 200 MG tablet Commonly known as: ADVIL Take 200 mg by mouth every 6 (six) hours as needed.   lisinopril-hydrochlorothiazide 20-25 MG tablet Commonly known as: ZESTORETIC TK 1 T PO QD   Mitigare 0.6 MG Caps Generic drug: Colchicine TAKE 1 TABLET BY MOUTH TWICE A DAY AS NEEDED FOR ACUTE GOUT PAIN   Multi-Vitamins Tabs Take by mouth.   PSYLLIUM HUSK PO Take by mouth.   RED YEAST RICE PO   sildenafil 100 MG tablet Commonly known as: VIAGRA Take by mouth.       Allergies: No Known Allergies  Family History: Family History  Problem Relation Age of Onset  . Benign prostatic hyperplasia Father   . Heart disease Father   . Diabetes Mellitus II Father   . Colon cancer Father   . Throat cancer Paternal Grandfather   . Prostate cancer Paternal Grandfather   . Heart disease Brother   . Bladder Cancer Neg Hx   . Kidney cancer Neg Hx     Social History:  reports that he has never smoked. He has never used smokeless tobacco. He reports that he does not drink alcohol or use drugs.  ROS: UROLOGY Frequent Urination?: Yes Hard to postpone urination?: No Burning/pain with urination?: No Get up at night to urinate?: Yes Leakage of urine?: No Urine stream starts and stops?: No Trouble starting stream?: Yes Do you have to strain to urinate?: No Blood in urine?: No Urinary tract infection?: No Sexually transmitted disease?: No Injury to kidneys or bladder?: No Painful intercourse?: No Weak stream?: Yes Erection problems?: No Penile pain?: No  Gastrointestinal Nausea?: No Vomiting?: No Indigestion/heartburn?: No Diarrhea?: No Constipation?: No  Constitutional Fever: No Night sweats?: No Weight loss?: No Fatigue?: No  Skin Skin rash/lesions?: No Itching?: No  Eyes Blurred vision?: No Double vision?: No  Ears/Nose/Throat Sore throat?: No Sinus problems?: No  Hematologic/Lymphatic Swollen glands?: No Easy bruising?: No  Cardiovascular Leg  swelling?: No Chest pain?: No  Respiratory Cough?: No Shortness of breath?: No  Endocrine Excessive thirst?: No  Musculoskeletal Back pain?: No Joint pain?: No  Neurological Headaches?: No  Dizziness?: No  Psychologic Depression?: No Anxiety?: No  Physical Exam: BP (!) 149/91 (BP Location: Left Arm, Patient Position: Sitting)   Pulse 80   Ht 5\' 8"  (1.727 m)   Wt 240 lb (108.9 kg)   BMI 36.49 kg/m   Constitutional:  Well nourished. Alert and oriented, No acute distress. HEENT: [place] AT, moist mucus membranes.  Trachea midline, no masses. Cardiovascular: No clubbing, cyanosis, or edema. Respiratory: Normal respiratory effort, no increased work of breathing. GI: Abdomen is soft, non tender, non distended, no abdominal masses. Liver and spleen not palpable.  No hernias appreciated.  Stool sample for occult testing is not indicated.   GU: No CVA tenderness.  No bladder fullness or masses.  Patient with circumcised phallus.  Urethral meatus is patent.  No penile discharge. No penile lesions or rashes. Scrotum without lesions, cysts, rashes and/or edema.  Testicles are located scrotally bilaterally. No masses are appreciated in the testicles. Left and right epididymis are normal. Rectal: Patient with  normal sphincter tone. Anus and perineum without scarring or rashes. No rectal masses are appreciated. Prostate is approximately 50 grams, could only palpate the apex and the midportion of the gland, no nodules are appreciated. Seminal vesicles could not be palpated.   Skin: No rashes, bruises or suspicious lesions. Lymph: No inguinal adenopathy. Neurologic: Grossly intact, no focal deficits, moving all 4 extremities. Psychiatric: Normal mood and affect.  Laboratory Data: Lab Results  Component Value Date   WBC 9.9 09/18/2015   HGB 15.3 09/18/2015   HCT 42.9 09/18/2015   MCV 93.6 09/18/2015   PLT 216 09/18/2015    Lab Results  Component Value Date   CREATININE 1.08 09/18/2015     No results found for: PSA  No results found for: TESTOSTERONE  No results found for: HGBA1C  Lab Results  Component Value Date   TSH 3.486 09/18/2015    No results found for: CHOL, HDL, CHOLHDL, VLDL, LDLCALC  No results found for: AST No results found for: ALT No components found for: ALKALINEPHOPHATASE No components found for: BILIRUBINTOTAL  No results found for: ESTRADIOL  Urinalysis No results found for: COLORURINE, APPEARANCEUR, LABSPEC, PHURINE, GLUCOSEU, HGBUR, BILIRUBINUR, KETONESUR, PROTEINUR, UROBILINOGEN, NITRITE, LEUKOCYTESUR  I have reviewed the labs.   Pertinent Imaging: Results for KERNEY, HOPFENSPERGER (MRN [id number]) as of 01/20/2019 10:32  Ref. Range 01/20/2019 10:13  Scan Result Unknown 17ml     Assessment & Plan:    1. History of elevated PSA PSA pending at this time - if remains elevated will pursue prostate MRI  2. BPH with LUTS IPSS score is 12/3 Continue conservative management, avoiding bladder irritants and timed voiding's Most bothersome symptoms is/are a weak urinary stream and nocturia - he attributes the weak urinary stream to the allergy medication and he will stop that medication and see if his symptoms improve RTC in 4 months for IPSS, PSA and exam - if PSA returns to normal   3. Nocturia I have explained that research studies have showed that over 84% of patients with sleep apnea reported frequent nighttime urination.    The patient may benefit from a discussion with his or her primary care physician to see if he or she has risk factors for sleep apnea or other sleep disturbances and obtaining a sleep study.  4. Erectile dysfunction SHIM score is 16 Continue sildenafil RTC in 4 months for repeat SHIM score and exam - pending PSA results   Return for Pending PSA resutls .  These notes  generated with voice recognition software. I apologize for typographical errors.  Zara Council, PA-C  [facility] [address]  [place], [place] [address] [phone]

## 2019-01-20 ENCOUNTER — Encounter: Payer: Self-pay | Admitting: Urology

## 2019-01-20 ENCOUNTER — Ambulatory Visit (INDEPENDENT_AMBULATORY_CARE_PROVIDER_SITE_OTHER): Payer: 59 | Admitting: Urology

## 2019-01-20 ENCOUNTER — Other Ambulatory Visit: Payer: Self-pay

## 2019-01-20 VITALS — BP 149/91 | HR 80 | Ht 68.0 in | Wt 240.0 lb

## 2019-01-20 DIAGNOSIS — N138 Other obstructive and reflux uropathy: Secondary | ICD-10-CM

## 2019-01-20 DIAGNOSIS — N529 Male erectile dysfunction, unspecified: Secondary | ICD-10-CM | POA: Diagnosis not present

## 2019-01-20 DIAGNOSIS — R351 Nocturia: Secondary | ICD-10-CM | POA: Diagnosis not present

## 2019-01-20 DIAGNOSIS — Z87898 Personal history of other specified conditions: Secondary | ICD-10-CM

## 2019-01-20 DIAGNOSIS — N401 Enlarged prostate with lower urinary tract symptoms: Secondary | ICD-10-CM

## 2019-01-20 DIAGNOSIS — R972 Elevated prostate specific antigen [PSA]: Secondary | ICD-10-CM

## 2019-01-20 LAB — BLADDER SCAN AMB NON-IMAGING

## 2019-01-21 LAB — PSA: Prostate Specific Ag, Serum: 5 ng/mL — ABNORMAL HIGH (ref 0.0–4.0)

## 2019-02-15 ENCOUNTER — Telehealth: Payer: Self-pay | Admitting: Family Medicine

## 2019-02-15 ENCOUNTER — Other Ambulatory Visit: Payer: 59

## 2019-02-15 DIAGNOSIS — R972 Elevated prostate specific antigen [PSA]: Secondary | ICD-10-CM

## 2019-02-15 NOTE — Telephone Encounter (Signed)
-----   Message from Nori Riis, PA-C sent at 02/15/2019  1:36 PM EDT ----- Please reach out to Mr. Loveday.  I had sent him a MyChart message, but I do not believe he has read it.  I would like for him to have a prostate MRI as his PSA has not returned to baseline.  Would he like to go ahead and schedule this now?

## 2019-02-15 NOTE — Telephone Encounter (Signed)
LMOM for patient to return call.

## 2019-02-16 NOTE — Telephone Encounter (Signed)
Patient notified he states he did not see that message and will go ahead with scheduling MRI order was placed

## 2019-02-16 NOTE — Telephone Encounter (Signed)
Ok I will get this approved and contact scheduling to call the patient.  Thanks, Sharyn Lull

## 2019-02-17 ENCOUNTER — Ambulatory Visit: Payer: 59 | Admitting: Urology

## 2019-03-02 ENCOUNTER — Other Ambulatory Visit: Payer: Self-pay

## 2019-03-02 ENCOUNTER — Telehealth: Payer: Self-pay | Admitting: Family Medicine

## 2019-03-02 ENCOUNTER — Ambulatory Visit
Admission: RE | Admit: 2019-03-02 | Discharge: 2019-03-02 | Disposition: A | Discharge status: Home / Self Care | Payer: 59 | Source: Ambulatory Visit | Attending: Urology | Admitting: Urology

## 2019-03-02 DIAGNOSIS — R972 Elevated prostate specific antigen [PSA]: Secondary | ICD-10-CM | POA: Insufficient documentation

## 2019-03-02 LAB — POCT I-STAT CREATININE: Creatinine, Ser: 1.2 mg/dL (ref 0.61–1.24)

## 2019-03-02 MED ORDER — GADOBUTROL 1 MMOL/ML IV SOLN
10.0000 mL | Freq: Once | INTRAVENOUS | Status: AC | PRN
  Administered 2019-03-02: 10 mL via INTRAVENOUS

## 2019-03-02 NOTE — Telephone Encounter (Signed)
-----   Message from Nori Riis, PA-C sent at 03/02/2019  9:34 AM EDT ----- I have spoken to Mr. Sadlowski regarding his prostate MRI results and have recommended that he undergo a fusion biopsy at [facility].  He takes a daily ASA and is managed by Dr. Ubaldo Glassing.  We will need to get clearance from him for Mr. Eischens to stop the ASA.    I explained the procedure to Mr. Matura and the risks involved, such as blood in urine, blood in stool, blood in semen, infection, urinary retention, and on rare occasions sepsis and death.  Patient understands the risks as explained to him and he wishes to proceed.

## 2019-03-02 NOTE — Telephone Encounter (Signed)
Cardiac clearance faxed to Dr. Ubaldo Glassing

## 2019-04-02 ENCOUNTER — Telehealth: Payer: Self-pay | Admitting: Urology

## 2019-04-02 NOTE — Telephone Encounter (Signed)
Will you apologize to Jeff Rice and tell him that we will reach out to the folks in [place] to inquire about when his fusion biopsy is to be scheduled?

## 2019-04-02 NOTE — Telephone Encounter (Signed)
Pt states in September he was told he needed a fusion biopsy, pt is calling to follow up because he hasn't heard from anyone.  Please advise [phone].

## 2019-04-05 ENCOUNTER — Other Ambulatory Visit: Payer: Self-pay | Admitting: Urology

## 2019-04-05 DIAGNOSIS — R972 Elevated prostate specific antigen [PSA]: Secondary | ICD-10-CM

## 2019-04-05 NOTE — Progress Notes (Unsigned)
I have placed the referral for the fusion biopsy for Mr. Porteous.

## 2019-05-06 ENCOUNTER — Telehealth: Payer: Self-pay | Admitting: Urology

## 2019-05-06 NOTE — Telephone Encounter (Signed)
Pt called and wanted to know if it was necessary for him to keep upcoming appts for lab (PSA) and appt w/Brandon on 12?15.  His fusion bx hasn't been scheduled yet and they told him it would be January at the earliest.  Pt said if he didn't answer, please leave a message and he would call right back.

## 2019-05-06 NOTE — Telephone Encounter (Signed)
Spoke with patient, would like to know if this can be done in our office due to the delay in scheduling fusion biopsy. He was in [place] and returned last week.

## 2019-05-07 NOTE — Telephone Encounter (Signed)
I agree that it is taken too long to get him this fusion biopsy.  We can just do it in our office using a cognitive fusion technique.    This would involve [place] mapping this area in her hands and try to biopsy this preferentially.  Its less accurate than a fusion biopsy but I feel like we probably should not be waiting for this.    Please work him into my clinic next week for biopsy.  Hollice Espy, MD

## 2019-05-10 NOTE — Telephone Encounter (Signed)
Left VM asked to return call

## 2019-05-11 ENCOUNTER — Other Ambulatory Visit: Payer: 59

## 2019-05-18 ENCOUNTER — Ambulatory Visit: Payer: 59 | Admitting: Urology

## 2019-05-20 ENCOUNTER — Other Ambulatory Visit: Payer: Self-pay | Admitting: Urology

## 2019-05-21 ENCOUNTER — Ambulatory Visit (INDEPENDENT_AMBULATORY_CARE_PROVIDER_SITE_OTHER): Payer: BC Managed Care – PPO | Admitting: Urology

## 2019-05-21 ENCOUNTER — Encounter: Payer: Self-pay | Admitting: Urology

## 2019-05-21 ENCOUNTER — Other Ambulatory Visit: Payer: Self-pay

## 2019-05-21 VITALS — BP 124/81 | HR 79 | Ht 68.0 in | Wt 240.0 lb

## 2019-05-21 DIAGNOSIS — C61 Malignant neoplasm of prostate: Secondary | ICD-10-CM

## 2019-05-21 NOTE — Progress Notes (Signed)
05/21/2019 11:38 AM   Jeff Rice 08-11-1965 [id number]  Referring provider: No referring provider defined for this encounter.  Chief Complaint  Patient presents with  . Follow-up    HPI: 53 y.o. male presents for prostate biopsy follow-up.    - history of a fluctuating PSA which was persistently elevated at 5.0 August 2020  - MRI 9/20; 55 cc volume; PI-RADS 4 lesion RLA with indistinctness of capsule in this area suspicious for extracapsular extension; negative evidence SV involvement or pelvic adenopathy  - MR guided fusion biopsy performed 12/15; 5 cores obtained PI-RADS 4 lesion in addition to 12 core standard biopsy  - No post biopsy complaints  - Pathology 5/5 cores ROI positive Gleason 3+4 adenocarcinoma; perineural invasion noted; 9/12 standard cores positive; 3-Gleason 3+4; 6 Gleason 3+3    PMH: Past Medical History:  Diagnosis Date  . BPH (benign prostatic hyperplasia)   . ED (erectile dysfunction)   . HLD (hyperlipidemia)   . Hypertension   . Rising PSA level     Surgical History: Past Surgical History:  Procedure Laterality Date  . HERNIA REPAIR  2002  . salivary gland excision      Home Medications:  Allergies as of 05/21/2019   No Known Allergies     Medication List       Accurate as of May 21, 2019 11:59 PM. If you have any questions, ask your nurse or doctor.        allopurinol 100 MG tablet Commonly known as: ZYLOPRIM Take by mouth.   BLACK CURRANT SEED OIL PO   Cholecalciferol 100 MCG (4000 UT) Caps Take by mouth.   ibuprofen 200 MG tablet Commonly known as: ADVIL Take 200 mg by mouth every 6 (six) hours as needed.   levofloxacin 750 MG tablet Commonly known as: LEVAQUIN Take 750 mg by mouth daily.   lisinopril-hydrochlorothiazide 20-25 MG tablet Commonly known as: ZESTORETIC TK 1 T PO QD   Mitigare 0.6 MG Caps Generic drug: Colchicine TAKE 1 TABLET BY MOUTH TWICE A DAY AS NEEDED FOR ACUTE GOUT PAIN     Multi-Vitamins Tabs Take by mouth.   PSYLLIUM HUSK PO Take by mouth.   RED YEAST RICE PO   sildenafil 100 MG tablet Commonly known as: VIAGRA Take by mouth.       Allergies: No Known Allergies  Family History: Family History  Problem Relation Age of Onset  . Benign prostatic hyperplasia Father   . Heart disease Father   . Diabetes Mellitus II Father   . Colon cancer Father   . Throat cancer Paternal Grandfather   . Prostate cancer Paternal Grandfather   . Heart disease Brother   . Bladder Cancer Neg Hx   . Kidney cancer Neg Hx     Social History:  reports that he has never smoked. He has never used smokeless tobacco. He reports that he does not drink alcohol or use drugs.  ROS: No significant change 8/19  Physical Exam: BP 124/81   Pulse 79   Ht 5\' 8"  (1.727 m)   Wt 240 lb (108.9 kg)   BMI 36.49 kg/m   Constitutional:  Alert and oriented, No acute distress.   Assessment & Plan:    - Intermediate risk (unfavorable) prostate cancer The pathology report was discussed in detail with Jeff Rice.  He has high-volume intermediate risk disease and treatment is recommended.  We discussed standard curative options of radical prostatectomy and radiation modalities.  He has primarily interested in pursuing  RALP.  Will  schedule a preoperative appointment with Dr. Erlene Quan.  Greater than 50% of this 15-minute visit was spent counseling the patient.   Abbie Sons, [place] [address] [place], [place] [address] [phone]

## 2019-05-22 ENCOUNTER — Encounter: Payer: Self-pay | Admitting: Urology

## 2019-05-29 ENCOUNTER — Telehealth: Payer: Self-pay | Admitting: Urology

## 2019-05-29 NOTE — Telephone Encounter (Signed)
Mr. Mousel was diagnosed with prostate cancer and needs to have a follow up appointment with Dr. Erlene Quan to discuss treatment options.

## 2019-05-31 ENCOUNTER — Other Ambulatory Visit: Payer: Self-pay | Admitting: Radiology

## 2019-06-01 NOTE — Telephone Encounter (Signed)
I called and spoke with pt to schedule appt and he states that he has already discussed it with Dr Bernardo Heater. He states possible surgery for removal of prostate in February 2021.

## 2019-06-09 ENCOUNTER — Other Ambulatory Visit: Payer: Self-pay | Admitting: Radiology

## 2019-06-09 DIAGNOSIS — C61 Malignant neoplasm of prostate: Secondary | ICD-10-CM

## 2019-06-14 ENCOUNTER — Other Ambulatory Visit: Payer: Self-pay

## 2019-06-14 ENCOUNTER — Ambulatory Visit: Payer: BC Managed Care – PPO | Attending: Urology | Admitting: Physical Therapy

## 2019-06-14 ENCOUNTER — Encounter: Payer: Self-pay | Admitting: Physical Therapy

## 2019-06-14 DIAGNOSIS — M62838 Other muscle spasm: Secondary | ICD-10-CM | POA: Insufficient documentation

## 2019-06-14 DIAGNOSIS — R2689 Other abnormalities of gait and mobility: Secondary | ICD-10-CM | POA: Insufficient documentation

## 2019-06-14 DIAGNOSIS — M533 Sacrococcygeal disorders, not elsewhere classified: Secondary | ICD-10-CM | POA: Diagnosis present

## 2019-06-14 NOTE — Patient Instructions (Signed)
Stretches:    1)  Frog stretch: laying on belly , knees bent, inhale do nothing, exhale let ankles fall apart   15reps   2)  A)  childs poses rocking  Toes tucked, shoulders down and back, on forearms , hands shoulder width apart  10 reps   B) left and right   3)  Open book ( handout)   ____  Sleep with pillow between knees when on your side   ____  Breathing inhale 1-2 pause, exhale 2-1 pause  Knees bent,   + quick pelvic squeeze 10 reps   After breakfast, after lunch, and dinner  Lying on your back   ____   Avoid straining pelvic floor, abdominal muscles , spine  Use log rolling technique instead of getting out of bed with your neck or the sit-up     Log rolling into and out of bed   Log rolling into and out of bed If getting out of bed on R side, Bent knees, scoot hips/ shoulder to L  Raise R arm completely overhead, rolling onto armpit  Then lower bent knees to bed to get into complete side lying position  Then drop legs off bed, and push up onto R elbow/forearm, and use L hand to push onto the bed    Proper body mechanics with getting out of a chair to decrease strain  on back &pelvic floor   Avoid holding your breath when Getting out of the chair:  Scoot to front part of chair chair Heels behind feet, feet are hip width apart, nose over toes  Inhale like you are smelling roses Exhale to stand

## 2019-06-15 NOTE — Therapy (Addendum)
[place] MAIN [facility] SERVICES [address] [place], [place], [address] Phone: [phone]   Fax:  [phone]  Physical Therapy Evaluation  Patient Details  Name: Jeff Rice MRN: [id number] Date of Birth: May 27, 1966 Referring Provider (PT): Erlene Quan    Encounter Date: 06/14/2019  PT End of Session - 06/14/19 1549    Visit Number  1    Number of Visits  2    Date for PT Re-Evaluation  06/28/19    PT Start Time  1400    PT Stop Time  1500    PT Time Calculation (min)  60 min    Activity Tolerance  Patient tolerated treatment well    Behavior During Therapy  [facility] for tasks assessed/performed       Past Medical History:  Diagnosis Date  . BPH (benign prostatic hyperplasia)   . ED (erectile dysfunction)   . HLD (hyperlipidemia)   . Hypertension   . Rising PSA level     Past Surgical History:  Procedure Laterality Date  . HERNIA REPAIR  2002  . salivary gland excision      There were no vitals filed for this visit.   Subjective Assessment - 06/14/19 1415    Subjective  Pt is scheduled for prostate cancer surgery on 07/15/19. Daily fluid intake: 64- 128 oz of water, 1 -2 cup of coffee, no juices, 30 fl oz of tea.   Started exercise and eating routine this week .  Sit up and crunches.  Sometimes with LBP when exercising.  Sedentary job.  Denied heavy lifting on the job.    Pertinent History  Denied surgeries, injuries.         [facility] PT Assessment - 06/14/19 1421      Assessment   Medical Diagnosis  Prostate Cancer     Referring Provider (PT)  Erlene Quan     Onset Date/Surgical Date  07/05/19      Precautions   Precautions  None      Restrictions   Weight Bearing Restrictions  No      Balance Screen   Has the patient fallen in the past 6 months  No      [place] residence    Home Layout  One level      Prior Function   Level of Independence  Independent      Observation/Other  Assessments   Observations  slumped sitting      Coordination   Gross Motor Movements are Fluid and Coordinated  --   diaphragmatic breathing  Pelvic floor on L limited in mobility with cues for pelvic floor contractions      AROM   Overall AROM Comments  spinal WFL       PROM   Overall PROM Comments  FADDIR B restrictions ,      Strength   Overall Strength Comments  hip flex/ knee/ ext 5/5 B, L hip ext 4-/5, R 5/5,  ( post Tx: hip ext  L 5/5)         Palpation   Spinal mobility  thoracic hypomobility  ( decreased post Tx)      SI assessment   L PSIS more posterior, ( area of pain when exercising)     Palpation comment  hypomobile L SIJ lacking nutation, L coccygeus ( improved post Tx )        Bed Mobility   Bed Mobility  --   crunching up  Objective measurements completed on examination: See above findings.    Pelvic Floor Special Questions - 06/14/19 1448    External Perineal Exam  through clothing, L pelvic floor tighter > R,   no overuse of glut with cue for pelvic floor contraction       [facility] Adult PT Treatment/Exercise - 06/15/19 1423      Bed Mobility   Bed Mobility  --   crunching up     Neuro Re-ed    Neuro Re-ed Details   deep core and pelvic floor coordination, body mechanics       Manual Therapy   Manual therapy comments  LLE long axis distraction, rotational mob, PA mob Grade III at L SIJ, to promote more nutation of sacrum , decreasing coccgyeus tightness L             PT Short Term Goals - 06/21/19 1146      PT Short TERM GOAL #1   Title  Pt will demo increased SIJ mobility, more nutation of sacrum, less L coccygeus tightness across 2 visits in order to perform deep core and pelvic floor HEP    Time  2    Period  Weeks    Status  New    Target Date  07/05/19      PT Short TERM GOAL #2   Title  Pt will demo more symmetrial movement of pelvic floor to progress to endurance training exercises    Time  2    Period   Weeks    Status  New    Target Date  07/05/19      PT Short  TERM GOAL #3   Title  Pt will be IND and demo proper body mechanics to minimize straining pelvic floor and back/ ab mm, minimize risk for pelvic floor dysfunction    Time  1    Period  Weeks    Status  New                    Plan - 06/14/19 1550    Clinical Impression Statement  Pt is a  54   yo male who is scheduled for prostatectomy on 07/05/19. Pt was referred to [facility] PT to train his pelvic floor mm to yield better outcomes with continence post-surgery. Pt 's clinical presentations included signs of poor intraabdominal pressure system which is associated increased risk for urinary incontinence: _pelvic malalignment, SIJ restrictions, L posterior pelvic floor mm tightness, thoracic hypomobility _asymmetrical pelvic floor contractions _lack of understanding on exercises that places less strain on the abdomen/pelvic floor.   Pt was provided education on etiology of Sx with anatomy, physiology explanation with images along with the benefits of customized pelvic PT Tx based on pt's medical conditions and musculoskeletal deficits.   Following Tx, pt demo'd decreased thoracic hypomobility, pelvic floor mm tightness, more equally aligned pelvic girdle, more SIJ mobility. Pt benefits from skilled PT with one more visit before his surgery. Progressed to plan to progress to endurance pelvic floor training.     Stability/Clinical Decision Making  Stable/Uncomplicated    Clinical Decision Making  Moderate    Rehab Potential  Good    PT Frequency  1x / week    PT Duration  2 weeks    PT Treatment/Interventions  ADLs/Self Care Home Management;Neuromuscular re-education;Manual techniques;Functional mobility training;Therapeutic activities;Taping;Moist Heat;Therapeutic exercise;Balance training    Consulted and Agree with Plan of Care  Patient       Patient  will benefit from skilled therapeutic intervention in order to  improve the following deficits and impairments:  Increased muscle spasms, Postural dysfunction, Improper body mechanics, Decreased strength, Decreased range of motion, Decreased endurance, Decreased coordination, Decreased mobility, Hypomobility, Impaired flexibility  Visit Diagnosis: Other muscle spasm  Sacrococcygeal disorders, not elsewhere classified  Other abnormalities of gait and mobility     Problem List Patient Active Problem List   Diagnosis Date Noted  . Elevated PSA 09/14/2016  . 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 Abdominal mass, left lower quadrant 09/13/2016  . Atypical chest pain 09/14/2015  . BP (high blood pressure) 09/14/2015  . Stress fracture 05/13/2015  . Heel pain 05/13/2015  . Benign essential hypertension 11/09/2014  . Erectile dysfunction 11/09/2014  . Pure hypercholesterolemia 11/09/2014    [name] ,PT, DPT, E-RYT  06/15/2019, 2:24 PM  [place] MAIN [facility] SERVICES [address] [place], [place], [address] Phone: [phone]   Fax:  [phone]  Name: Jeff Rice MRN: [id number] Date of Birth: [birth year]

## 2019-06-21 ENCOUNTER — Ambulatory Visit: Payer: BC Managed Care – PPO | Admitting: Physical Therapy

## 2019-06-21 ENCOUNTER — Other Ambulatory Visit: Payer: Self-pay

## 2019-06-21 DIAGNOSIS — R2689 Other abnormalities of gait and mobility: Secondary | ICD-10-CM

## 2019-06-21 DIAGNOSIS — M533 Sacrococcygeal disorders, not elsewhere classified: Secondary | ICD-10-CM

## 2019-06-21 DIAGNOSIS — M62838 Other muscle spasm: Secondary | ICD-10-CM

## 2019-06-21 NOTE — Patient Instructions (Addendum)
Lying on your back: Morning   Deep core level 1 ( breathing ) with a quick squeeze) 10 reps   Deep core level 2 ( handout) knee out one and time on exhale  6 min   Long pelvic floor holds 3 sec, 2 rest breaths in between, 10 reps    _____  Midmorning  Seated  with a quick squeeze) 10 reps    _____  Lunch  Long pelvic floor holds 3 sec, 2 rest breaths in between, 10 reps   ____ midafternoon  Seated  with a quick squeeze) 10 reps   ____  [employer] on your back: Dinner Deep core level 1 ( breathing ) with a quick squeeze) 10 reps   Deep core level 2 ( handout) knee out one and time on exhale  6 min   Long pelvic floor holds 3 sec, 2 rest breaths in between, 10 reps   ____  Continue with stretches both times in the day  -open book -childs pose -frog   _Stretch for pelvic floor   V- slides  "v heels slide away and then back toward buttocks and then rock knee to slight ,  slide heel along at 11 o clock away from buttocks   10 reps   _Seated figure-4 5 breaths  _hip flexor ( foot propped on stool not on wheels) and placed in 45 deg , rocking forward and back  ___   ** DO NOT DO THE PELVIC FLOOR SQUEEZES WHEN CATHETER IS IN   ___  Avoid tightening the gluts in fitness exercises  Bridge with more activation in back arms and back shoulders Exhale to lift hips    Kettle bell swings with more knees bend slightly not locking knees and not thrusting hips forward/ tightening gluts   Variations to crunches:  standing bicycle crunches   with exhale on lift    Plank ( notice to pull heels back to shift center of mass more balanced   Pull resistance bands with exhale       Be sure to stretch for 10-15 min with each workout

## 2019-06-21 NOTE — Addendum Note (Signed)
Addended by: [name] on: 06/21/2019 11:57 AM   Modules accepted: Orders

## 2019-06-22 ENCOUNTER — Other Ambulatory Visit: Payer: Self-pay

## 2019-06-22 ENCOUNTER — Telehealth (INDEPENDENT_AMBULATORY_CARE_PROVIDER_SITE_OTHER): Payer: BC Managed Care – PPO | Admitting: Urology

## 2019-06-22 DIAGNOSIS — N529 Male erectile dysfunction, unspecified: Secondary | ICD-10-CM | POA: Diagnosis not present

## 2019-06-22 DIAGNOSIS — C61 Malignant neoplasm of prostate: Secondary | ICD-10-CM | POA: Diagnosis not present

## 2019-06-22 NOTE — Therapy (Signed)
[place] MAIN [facility] SERVICES [address] [place], [place], [address] Phone: [phone]   Fax:  [phone]  Physical Therapy Treatment / Discharge Summary   Patient Details  Name: Jeff Rice MRN: [id number] Date of Birth: 06-05-1965 Referring Provider (PT): Erlene Quan    Encounter Date: 06/21/2019  PT End of Session - 06/21/19 1552    Visit Number  2    Number of Visits  2    Date for PT Re-Evaluation  06/28/19    PT Start Time  1500    PT Stop Time  1602    PT Time Calculation (min)  62 min    Activity Tolerance  Patient tolerated treatment well    Behavior During Therapy  [facility] for tasks assessed/performed       Past Medical History:  Diagnosis Date  . BPH (benign prostatic hyperplasia)   . ED (erectile dysfunction)   . HLD (hyperlipidemia)   . Hypertension   . Rising PSA level     Past Surgical History:  Procedure Laterality Date  . HERNIA REPAIR  2002  . salivary gland excision      There were no vitals filed for this visit.  Subjective Assessment - 06/21/19 1457    Subjective  Pt had no questions from last session    Pertinent History  Denied surgeries, injuries.         [facility] PT Assessment - 06/22/19 0949      AROM   Overall AROM Comments  spinal WFL       PROM   Overall PROM Comments  FADDIR mobility restored       Strength   Overall Strength Comments  hip ext B 5/5       Palpation   Spinal mobility  thoracic mobility restored    SI assessment   PSIS more levelled     Palpation comment  no tightness at L coccgyeus                 Pelvic Floor Special Questions - 06/21/19 1518    External Perineal Exam  through clothing, no L pelvic floor tightness, no overuse of glut with cue for pelvic floor contraction    External Palpation  3 sec, 10 reps with minor cues        [facility] Adult PT Treatment/Exercise - 06/22/19 0948      Therapeutic Activites    Therapeutic Activities  Other  Therapeutic Activities    Other Therapeutic Activities  demo'd and explained alternative options to sit ups/ bicylce crunches, planks      Neuro Re-ed    Neuro Re-ed Details   cued for proper technique deep core level 2  , seated pelvic floor contractions, supine endurance holds                   PT Long Term Goals - 06/21/19 1552      PT LONG TERM GOAL #1   Title  Pt will demo increased SIJ mobility, more nutation of sacrum, less L coccygeus tightness across 2 visits in order to perform deep core and pelvic floor HEP    Time  2    Period  Weeks    Status  Achieved      PT LONG TERM GOAL #2   Title  Pt will demo more symmetrial movement of pelvic floor to progress to endurance training exercises    Time  2    Period  Weeks  Status  Achieved      PT LONG TERM GOAL #3   Title  Pt will be IND and demo proper body mechanics to minimize straining pelvic floor and back/ ab mm, minimize risk for pelvic floor dysfunction    Time  1    Period  Weeks    Status  Achieved            Plan - 06/22/19 0948    Clinical Impression Statement  Pt has achieved 100% of his goals. Pt demo'd equal alignment of SIJ, coccyx, and less pelvic floor mm tightness on L. Pt demo'd proper deep core coordination/ pelvic floor strengthening, stretches, and alternatives to fitness exercises to minimize straining ab/ back/ pelvic floor mm. Pt is ready for d/c at this time as pt is IND with HEP to help minimize RALP post-op related incontinence.    Stability/Clinical Decision Making  Stable/Uncomplicated    Rehab Potential  Good    PT Frequency  1x / week    PT Duration  2 weeks    PT Treatment/Interventions  ADLs/Self Care Home Management;Neuromuscular re-education;Manual techniques;Functional mobility training;Therapeutic activities;Taping;Moist Heat;Therapeutic exercise;Balance training    Consulted and Agree with Plan of Care  Patient       Patient will benefit from skilled therapeutic  intervention in order to improve the following deficits and impairments:  Increased muscle spasms, Postural dysfunction, Improper body mechanics, Decreased strength, Decreased range of motion, Decreased endurance, Decreased coordination, Decreased mobility, Hypomobility, Impaired flexibility  Visit Diagnosis: Other muscle spasm  Sacrococcygeal disorders, not elsewhere classified  Other abnormalities of gait and mobility     Problem List Patient Active Problem List   Diagnosis Date Noted  . Elevated PSA 09/14/2016  . 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 Abdominal mass, left lower quadrant 09/13/2016  . Atypical chest pain 09/14/2015  . BP (high blood pressure) 09/14/2015  . Stress fracture 05/13/2015  . Heel pain 05/13/2015  . Benign essential hypertension 11/09/2014  . Erectile dysfunction 11/09/2014  . Pure hypercholesterolemia 11/09/2014    [name] ,PT, DPT, E-RYT  06/22/2019, 9:50 AM  [place] MAIN [facility] SERVICES [address] [place], [place], [address] Phone: [phone]   Fax:  [phone]  Name: Jeff Rice MRN: [id number] Date of Birth: 1966/04/19

## 2019-06-22 NOTE — Therapy (Deleted)
[place] MAIN [facility] SERVICES [address] [place], [place], [address] Phone: [phone]   Fax:  [phone]  Physical Therapy Evaluation  Patient Details  Name: Jeff Rice MRN: [id number] Date of Birth: 1965/08/23 Referring Provider (PT): Erlene Quan    Encounter Date: 06/21/2019  PT End of Session - 06/21/19 1552    Visit Number  2    Number of Visits  2    Date for PT Re-Evaluation  06/28/19    PT Start Time  1500    PT Stop Time  1602    PT Time Calculation (min)  62 min    Activity Tolerance  Patient tolerated treatment well    Behavior During Therapy  [facility] for tasks assessed/performed       Past Medical History:  Diagnosis Date  . BPH (benign prostatic hyperplasia)   . ED (erectile dysfunction)   . HLD (hyperlipidemia)   . Hypertension   . Rising PSA level     Past Surgical History:  Procedure Laterality Date  . HERNIA REPAIR  2002  . salivary gland excision      There were no vitals filed for this visit.   Subjective Assessment - 06/21/19 1457    Subjective  Pt had no questions from last session    Pertinent History  Denied surgeries, injuries.         [facility] PT Assessment - 06/22/19 0949      AROM   Overall AROM Comments  spinal WFL       PROM   Overall PROM Comments  FADDIR mobility restored       Strength   Overall Strength Comments  hip ext B 5/5       Palpation   Spinal mobility  thoracic mobility restored    SI assessment   PSIS more levelled     Palpation comment  no tightness at L coccgyeus                 Objective measurements completed on examination: See above findings.    Pelvic Floor Special Questions - 06/21/19 1518    External Perineal Exam  through clothing, no L pelvic floor tightness, no overuse of glut with cue for pelvic floor contraction    External Palpation  3 sec, 10 reps with minor cues       [facility] Adult PT Treatment/Exercise - 06/22/19 0948       Therapeutic Activites    Therapeutic Activities  Other Therapeutic Activities    Other Therapeutic Activities  demo'd and explained alternative options to sit ups/ bicylce crunches, planks      Neuro Re-ed    Neuro Re-ed Details   cued for proper technique deep core level 2  , seated pelvic floor contractions, supine endurance holds                   PT Long Term Goals - 06/21/19 1552      PT LONG TERM GOAL #1   Title  Pt will demo increased SIJ mobility, more nutation of sacrum, less L coccygeus tightness across 2 visits in order to perform deep core and pelvic floor HEP    Time  2    Period  Weeks    Status  Achieved      PT LONG TERM GOAL #2   Title  Pt will demo more symmetrial movement of pelvic floor to progress to endurance training exercises    Time  2  Period  Weeks    Status  Achieved      PT LONG TERM GOAL #3   Title  Pt will be IND and demo proper body mechanics to minimize straining pelvic floor and back/ ab mm, minimize risk for pelvic floor dysfunction    Time  1    Period  Weeks    Status  Achieved             Plan - 06/22/19 0948    Clinical Impression Statement  Pt    Stability/Clinical Decision Making  Stable/Uncomplicated    Rehab Potential  Good    PT Frequency  1x / week    PT Duration  2 weeks    PT Treatment/Interventions  ADLs/Self Care Home Management;Neuromuscular re-education;Manual techniques;Functional mobility training;Therapeutic activities;Taping;Moist Heat;Therapeutic exercise;Balance training    Consulted and Agree with Plan of Care  Patient       Patient will benefit from skilled therapeutic intervention in order to improve the following deficits and impairments:  Increased muscle spasms, Postural dysfunction, Improper body mechanics, Decreased strength, Decreased range of motion, Decreased endurance, Decreased coordination, Decreased mobility, Hypomobility, Impaired flexibility  Visit Diagnosis: Other muscle  spasm  Sacrococcygeal disorders, not elsewhere classified  Other abnormalities of gait and mobility     Problem List Patient Active Problem List   Diagnosis Date Noted  . Elevated PSA 09/14/2016  . 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 Abdominal mass, left lower quadrant 09/13/2016  . Atypical chest pain 09/14/2015  . BP (high blood pressure) 09/14/2015  . Stress fracture 05/13/2015  . Heel pain 05/13/2015  . Benign essential hypertension 11/09/2014  . Erectile dysfunction 11/09/2014  . Pure hypercholesterolemia 11/09/2014    Jeff Rice 06/22/2019, 9:50 AM  [place] MAIN [facility] SERVICES [address] [place], [place], [address] Phone: [phone]   Fax:  [phone]  Name: Jeff Rice MRN: [id number] Date of Birth: 1966/04/06

## 2019-06-24 ENCOUNTER — Other Ambulatory Visit: Payer: Self-pay

## 2019-06-24 ENCOUNTER — Other Ambulatory Visit: Payer: Self-pay | Admitting: Radiology

## 2019-06-24 DIAGNOSIS — C61 Malignant neoplasm of prostate: Secondary | ICD-10-CM

## 2019-06-24 NOTE — Progress Notes (Signed)
Virtual Visit via Telephone Note  I connected with Jeff Rice on 06/24/19 at 11:30 AM EST by telephone and verified that I am speaking with the correct person using two identifiers.  Location: Patient: home Provider: office   I discussed the limitations, risks, security and privacy concerns of performing an evaluation and management service by telephone and the availability of in person appointments. I also discussed with the patient that there may be a patient responsible charge related to this service. The patient expressed understanding and agreed to proceed.   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  He is available today by telephone for virtual visit to discuss the specifics of surgery.  He is essentially a fluctuating PSA which may persistently elevated 5.0.  He ultimately underwent prostate MRI on 9/20 which showed a 55 cc volume prostate of PI-RADS 4 lesion at the right left anterior capsule with suspicion for extracapsular extension at this location.  No evidence of adenopathy or seminal vesicle invasion.  He underwent fusion biopsy on 05/18/2019 which showed 5 5 cores of Gleason 3+4 in the area of interest.  In addition to this, 9 of 12 of the standard cores of Gleason 3+3 and 3+4, right side containing the intermediate risk tumor.  BMI is 36.  He has a personal treated bilateral inguinal hernia repair which based on on incision type is likely open technique.  He does have moderawtet erectile dysfunction at baseline and uses Viagra 100 mg as needed.  SHIM 16.  Minimal baseline urinary symptoms other than nocturia x2-3 with some occasional weak stream.  Previous IPSS 12/3.  He is already worked with physical therapy x2.  He is actually doing exercises in anticipation of surgery.   Observations/Objective: Asking  very insightful and appropriate questions  Assessment and Plan:  1. 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  He previously declined radiation oncology consult.  We discussed, in detail, the risks and expectations of surgery with regard to cancer control, urinary control, and erectile dysfunction as well as expected post operative recovery processed. Additional risks of surgery including but not limited to bleeding, infection, hernia formation, nerve damage, fistula formation, bowel/rectal injury, potentially necessitating colostomy, damage to the urinary tract resulting in urinary leakage, urethral stricture, and cardiopulmonary risk such as myocardial infarction, stroke, death, thromboembolism etc. were explained.   All questions answered.  2. Erectile dysfunction of organic origin Baseline moderate erectile dysfunction, lengthy and frank discussion today that erections will worsen following this procedure  We did briefly discuss penile rehab including penile stretching, sildenafil and intracavernosal injections.  The option of penile prosthesis also discussed on the future.  I provided 21 minutes of non-face-to-face time during this encounter.   Hollice Espy, MD

## 2019-06-24 NOTE — H&P (View-Only) (Signed)
Virtual Visit via Telephone Note  I connected with Jeff Rice on 06/24/19 at 11:30 AM EST by telephone and verified that I am speaking with the correct person using two identifiers.  Location: Patient: home Provider: office   I discussed the limitations, risks, security and privacy concerns of performing an evaluation and management service by telephone and the availability of in person appointments. I also discussed with the patient that there may be a patient responsible charge related to this service. The patient expressed understanding and agreed to proceed.   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  He is available today by telephone for virtual visit to discuss the specifics of surgery.  He is essentially a fluctuating PSA which may persistently elevated 5.0.  He ultimately underwent prostate MRI on 9/20 which showed a 55 cc volume prostate of PI-RADS 4 lesion at the right left anterior capsule with suspicion for extracapsular extension at this location.  No evidence of adenopathy or seminal vesicle invasion.  He underwent fusion biopsy on 05/18/2019 which showed 5 5 cores of Gleason 3+4 in the area of interest.  In addition to this, 9 of 12 of the standard cores of Gleason 3+3 and 3+4, right side containing the intermediate risk tumor.  BMI is 36.  He has a personal treated bilateral inguinal hernia repair which based on on incision type is likely open technique.  He does have moderawtet erectile dysfunction at baseline and uses Viagra 100 mg as needed.  SHIM 16.  Minimal baseline urinary symptoms other than nocturia x2-3 with some occasional weak stream.  Previous IPSS 12/3.  He is already worked with physical therapy x2.  He is actually doing exercises in anticipation of surgery.   Observations/Objective: Asking  very insightful and appropriate questions  Assessment and Plan:  1. 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  He previously declined radiation oncology consult.  We discussed, in detail, the risks and expectations of surgery with regard to cancer control, urinary control, and erectile dysfunction as well as expected post operative recovery processed. Additional risks of surgery including but not limited to bleeding, infection, hernia formation, nerve damage, fistula formation, bowel/rectal injury, potentially necessitating colostomy, damage to the urinary tract resulting in urinary leakage, urethral stricture, and cardiopulmonary risk such as myocardial infarction, stroke, death, thromboembolism etc. were explained.   All questions answered.  2. Erectile dysfunction of organic origin Baseline moderate erectile dysfunction, lengthy and frank discussion today that erections will worsen following this procedure  We did briefly discuss penile rehab including penile stretching, sildenafil and intracavernosal injections.  The option of penile prosthesis also discussed on the future.  I provided 21 minutes of non-face-to-face time during this encounter.   Hollice Espy, MD

## 2019-06-25 ENCOUNTER — Other Ambulatory Visit: Payer: Self-pay

## 2019-06-25 ENCOUNTER — Other Ambulatory Visit: Payer: BC Managed Care – PPO

## 2019-06-25 DIAGNOSIS — C61 Malignant neoplasm of prostate: Secondary | ICD-10-CM

## 2019-06-25 LAB — URINALYSIS, COMPLETE
Bilirubin, UA: NEGATIVE
Glucose, UA: NEGATIVE
Ketones, UA: NEGATIVE
Leukocytes,UA: NEGATIVE
Nitrite, UA: NEGATIVE
Protein,UA: NEGATIVE
RBC, UA: NEGATIVE
Specific Gravity, UA: 1.02 (ref 1.005–1.030)
Urobilinogen, Ur: 0.2 mg/dL (ref 0.2–1.0)
pH, UA: 6 (ref 5.0–7.5)

## 2019-06-25 LAB — MICROSCOPIC EXAMINATION
Bacteria, UA: NONE SEEN
RBC, Urine: NONE SEEN /hpf (ref 0–2)
WBC, UA: NONE SEEN /hpf (ref 0–5)

## 2019-06-28 LAB — CULTURE, URINE COMPREHENSIVE

## 2019-07-01 ENCOUNTER — Encounter
Admission: RE | Admit: 2019-07-01 | Discharge: 2019-07-01 | Disposition: A | Discharge status: Home / Self Care | Payer: BC Managed Care – PPO | Source: Ambulatory Visit | Attending: Urology | Admitting: Urology

## 2019-07-01 ENCOUNTER — Other Ambulatory Visit: Payer: Self-pay

## 2019-07-01 DIAGNOSIS — Z20822 Contact with and (suspected) exposure to covid-19: Secondary | ICD-10-CM | POA: Insufficient documentation

## 2019-07-01 DIAGNOSIS — Z01812 Encounter for preprocedural laboratory examination: Secondary | ICD-10-CM | POA: Diagnosis not present

## 2019-07-01 NOTE — Pre-Procedure Instructions (Addendum)
Patient has received cardiac clearance on 06/09/19 from Dr Ubaldo Glassing. Last EKG was  07/2018 NSR. Spoke to Dr Kayleen Memos, he was ok to proceed with this EKG as long as patient did not have any cardiac symptoms. Patient denied any cardiac symptoms prior or since  his previous EKG (07/2018) during PAT interview.    ECG 12-lead2/04/2019 [place] Component Name Value Ref Range  Vent Rate (bpm) 68   PR Interval (msec) 174   QRS Interval (msec) 108   QT Interval (msec) 420   QTc (msec) 446   Other Result Information  This result has an attachment that is not available.  Result Narrative  Normal sinus rhythm Normal ECG When compared with ECG of 13-Jan-2018 16:08, No significant change was found I reviewed and concur with this report. Electronically signed [id number] MD, KEN ([id number]) on 07/27/2018 8:46:27 AM

## 2019-07-01 NOTE — Patient Instructions (Signed)
Your procedure is scheduled on: 07/05/19 Report to [place]. To find out your arrival time please call [phone] between 1PM - 3PM on 07/02/19.  Remember: Instructions that are not followed completely may result in serious medical risk, up to and including death, or upon the discretion of your surgeon and anesthesiologist your surgery may need to be rescheduled.     _X__ 1. Do not eat food after midnight the night before your procedure.                 No gum chewing or hard candies. You may drink clear liquids up to 2 hours                 before you are scheduled to arrive for your surgery- DO not drink clear                 liquids within 2 hours of the start of your surgery.                 Clear Liquids include:  water, apple juice without pulp, clear carbohydrate                 drink such as Clearfast or Gatorade, Black Coffee or Tea (Do not add                 anything to coffee or tea). Diabetics water only  __X__2.  On the morning of surgery brush your teeth with toothpaste and water, you                 may rinse your mouth with mouthwash if you wish.  Do not swallow any              toothpaste of mouthwash.     _X__ 3.  No Alcohol for 24 hours before or after surgery.   _X__ 4.  Do Not Smoke or use e-cigarettes For 24 Hours Prior to Your Surgery.                 Do not use any chewable tobacco products for at least 6 hours prior to                 surgery.  ____  5.  Bring all medications with you on the day of surgery if instructed.   __X__  6.  Notify your doctor if there is any change in your medical condition      (cold, fever, infections).     Do not wear jewelry, make-up, hairpins, clips or nail polish. Do not wear lotions, powders, or perfumes.  Do not shave 48 hours prior to surgery. Men may shave face and neck. Do not bring valuables to the hospital.    [facility] is not responsible for any belongings or  valuables.  Contacts, dentures/partials or body piercings may not be worn into surgery. Bring a case for your contacts, glasses or hearing aids, a denture cup will be supplied. Leave your suitcase in the car. After surgery it may be brought to your room. For patients admitted to the hospital, discharge time is determined by your treatment team.   Patients discharged the day of surgery will not be allowed to drive home.   Please read over the following fact sheets that you were given:   MRSA Information  __X__ Take these medicines the morning of surgery with A SIP OF WATER:  1. none  2.   3.   4.  5.  6.  ____ Fleet Enema (as directed)   __X__ Use CHG Soap/SAGE wipes as directed  ____ Use inhalers on the day of surgery  ____ Stop metformin/Janumet/Farxiga 2 days prior to surgery    ____ Take 1/2 of usual insulin dose the night before surgery. No insulin the morning          of surgery.   ____ Stop Blood Thinners Coumadin/Plavix/Xarelto/Pleta/Pradaxa/Eliquis/Effient/Aspirin  on   Or contact your Surgeon, Cardiologist or Medical Doctor regarding  ability to stop your blood thinners  __X__ Stop Anti-inflammatories 7 days before surgery such as Advil, Ibuprofen, Motrin,  BC or Goodies Powder, Naprosyn, Naproxen, Aleve, Aspirin    __X__ Stop all herbal supplements, fish oil or vitamin E until after surgery.    ____ Bring C-Pap to the hospital.     Your last dose of your blood pressure medicine (Lisinopril/Hctz) will be Sunday 07/04/19. Do not take it the morning of surgery.

## 2019-07-02 ENCOUNTER — Other Ambulatory Visit
Admission: RE | Admit: 2019-07-02 | Discharge: 2019-07-02 | Disposition: A | Discharge status: Home / Self Care | Payer: BC Managed Care – PPO | Source: Ambulatory Visit | Attending: Urology | Admitting: Urology

## 2019-07-02 DIAGNOSIS — Z01812 Encounter for preprocedural laboratory examination: Secondary | ICD-10-CM | POA: Diagnosis not present

## 2019-07-02 LAB — BASIC METABOLIC PANEL
Anion gap: 10 (ref 5–15)
BUN: 22 mg/dL — ABNORMAL HIGH (ref 6–20)
CO2: 28 mmol/L (ref 22–32)
Calcium: 9.3 mg/dL (ref 8.9–10.3)
Chloride: 103 mmol/L (ref 98–111)
Creatinine, Ser: 1.22 mg/dL (ref 0.61–1.24)
GFR calc Af Amer: >60 mL/min (ref >60)
GFR calc non Af Amer: >60 mL/min (ref >60)
Glucose, Bld: 92 mg/dL (ref 70–99)
Potassium: 3.7 mmol/L (ref 3.5–5.1)
Sodium: 141 mmol/L (ref 135–145)

## 2019-07-02 LAB — CBC
HCT: 44.9 % (ref 39.0–52.0)
Hemoglobin: 15.9 g/dL (ref 13.0–17.0)
MCH: 33.3 pg (ref 26.0–34.0)
MCHC: 35.4 g/dL (ref 30.0–36.0)
MCV: 93.9 fL (ref 80.0–100.0)
Platelets: 275 10*3/uL (ref 150–400)
RBC: 4.78 MIL/uL (ref 4.22–5.81)
RDW: 12.3 % (ref 11.5–15.5)
WBC: 5.7 10*3/uL (ref 4.0–10.5)
nRBC: 0 % (ref 0.0–0.2)

## 2019-07-02 LAB — TYPE AND SCREEN
ABO/RH(D): O POS
Antibody Screen: NEGATIVE

## 2019-07-02 LAB — SARS CORONAVIRUS 2 (TAT 6-24 HRS): SARS Coronavirus 2: NEGATIVE

## 2019-07-05 ENCOUNTER — Encounter: Payer: Self-pay | Admitting: Urology

## 2019-07-05 ENCOUNTER — Ambulatory Visit: Payer: BC Managed Care – PPO | Admitting: Anesthesiology

## 2019-07-05 ENCOUNTER — Encounter
Admission: RE | Disposition: A | Discharge status: Home / Self Care | Payer: Self-pay | Source: Home / Self Care | Attending: Urology

## 2019-07-05 ENCOUNTER — Observation Stay
Admission: RE | Admit: 2019-07-05 | Discharge: 2019-07-06 | Disposition: A | Discharge status: Home / Self Care | Payer: BC Managed Care – PPO | Attending: Urology | Admitting: Urology

## 2019-07-05 ENCOUNTER — Other Ambulatory Visit: Payer: Self-pay

## 2019-07-05 DIAGNOSIS — N529 Male erectile dysfunction, unspecified: Secondary | ICD-10-CM | POA: Insufficient documentation

## 2019-07-05 DIAGNOSIS — C61 Malignant neoplasm of prostate: Secondary | ICD-10-CM | POA: Diagnosis present

## 2019-07-05 DIAGNOSIS — M109 Gout, unspecified: Secondary | ICD-10-CM | POA: Diagnosis not present

## 2019-07-05 DIAGNOSIS — I10 Essential (primary) hypertension: Secondary | ICD-10-CM | POA: Diagnosis not present

## 2019-07-05 HISTORY — PX: ROBOT ASSISTED LAPAROSCOPIC RADICAL PROSTATECTOMY: SHX5141

## 2019-07-05 LAB — ABO/RH: ABO/RH(D): O POS

## 2019-07-05 SURGERY — PROSTATECTOMY, RADICAL, ROBOT-ASSISTED, LAPAROSCOPIC
Anesthesia: General

## 2019-07-05 MED ORDER — FENTANYL CITRATE (PF) 100 MCG/2ML IJ SOLN
25.0000 ug | INTRAMUSCULAR | Status: DC | PRN
  Administered 2019-07-05: 50 ug via INTRAVENOUS
  Administered 2019-07-05 (×2): 25 ug via INTRAVENOUS

## 2019-07-05 MED ORDER — DEXAMETHASONE SODIUM PHOSPHATE 10 MG/ML IJ SOLN
INTRAMUSCULAR | Status: DC | PRN
  Administered 2019-07-05: 10 mg via INTRAVENOUS

## 2019-07-05 MED ORDER — PROPOFOL 10 MG/ML IV BOLUS
INTRAVENOUS | Status: AC
  Filled 2019-07-05: qty 40

## 2019-07-05 MED ORDER — CEFAZOLIN SODIUM-DEXTROSE 1-4 GM/50ML-% IV SOLN
1.0000 g | Freq: Three times a day (TID) | INTRAVENOUS | Status: AC
  Administered 2019-07-05 – 2019-07-06 (×2): 1 g via INTRAVENOUS
  Filled 2019-07-05 (×2): qty 50

## 2019-07-05 MED ORDER — DEXAMETHASONE SODIUM PHOSPHATE 10 MG/ML IJ SOLN
INTRAMUSCULAR | Status: AC
  Filled 2019-07-05: qty 1

## 2019-07-05 MED ORDER — LACTATED RINGERS IV SOLN: INTRAVENOUS | Status: DC

## 2019-07-05 MED ORDER — DIPHENHYDRAMINE HCL 50 MG/ML IJ SOLN: 12.5000 mg | Freq: Four times a day (QID) | INTRAMUSCULAR | Status: DC | PRN

## 2019-07-05 MED ORDER — THROMBIN 5000 UNITS EX SOLR
CUTANEOUS | Status: DC | PRN
  Administered 2019-07-05: 5000 [IU] via TOPICAL

## 2019-07-05 MED ORDER — OXYBUTYNIN CHLORIDE 5 MG PO TABS
5.0000 mg | ORAL_TABLET | Freq: Three times a day (TID) | ORAL | Status: DC | PRN
  Administered 2019-07-06: 5 mg via ORAL
  Filled 2019-07-05: qty 1

## 2019-07-05 MED ORDER — FAMOTIDINE 20 MG PO TABS: 20.0000 mg | ORAL_TABLET | Freq: Once | ORAL | Status: AC

## 2019-07-05 MED ORDER — SUGAMMADEX SODIUM 200 MG/2ML IV SOLN
INTRAVENOUS | Status: AC
  Filled 2019-07-05: qty 2

## 2019-07-05 MED ORDER — MIDAZOLAM HCL 2 MG/2ML IJ SOLN
INTRAMUSCULAR | Status: AC
  Filled 2019-07-05: qty 2

## 2019-07-05 MED ORDER — SODIUM CHLORIDE FLUSH 0.9 % IV SOLN
INTRAVENOUS | Status: AC
  Filled 2019-07-05: qty 10

## 2019-07-05 MED ORDER — DOCUSATE SODIUM 100 MG PO CAPS
100.0000 mg | ORAL_CAPSULE | Freq: Two times a day (BID) | ORAL | Status: DC
  Administered 2019-07-06 (×2): 100 mg via ORAL
  Filled 2019-07-05 (×2): qty 1

## 2019-07-05 MED ORDER — ONDANSETRON HCL 4 MG/2ML IJ SOLN: 4.0000 mg | INTRAMUSCULAR | Status: DC | PRN

## 2019-07-05 MED ORDER — LIDOCAINE HCL (CARDIAC) PF 100 MG/5ML IV SOSY
PREFILLED_SYRINGE | INTRAVENOUS | Status: DC | PRN
  Administered 2019-07-05: 100 mg via INTRAVENOUS

## 2019-07-05 MED ORDER — FENTANYL CITRATE (PF) 100 MCG/2ML IJ SOLN
INTRAMUSCULAR | Status: AC
  Administered 2019-07-05: 25 ug via INTRAVENOUS
  Filled 2019-07-05: qty 2

## 2019-07-05 MED ORDER — LISINOPRIL 20 MG PO TABS
20.0000 mg | ORAL_TABLET | Freq: Every day | ORAL | Status: DC
  Administered 2019-07-05 – 2019-07-06 (×2): 20 mg via ORAL
  Filled 2019-07-05 (×2): qty 1

## 2019-07-05 MED ORDER — FENTANYL CITRATE (PF) 100 MCG/2ML IJ SOLN
INTRAMUSCULAR | Status: AC
  Filled 2019-07-05: qty 2

## 2019-07-05 MED ORDER — KETOROLAC TROMETHAMINE 30 MG/ML IJ SOLN
INTRAMUSCULAR | Status: AC
  Filled 2019-07-05: qty 1

## 2019-07-05 MED ORDER — BELLADONNA ALKALOIDS-OPIUM 16.2-60 MG RE SUPP: 1.0000 | Freq: Four times a day (QID) | RECTAL | Status: DC | PRN

## 2019-07-05 MED ORDER — OXYCODONE-ACETAMINOPHEN 5-325 MG PO TABS
1.0000 | ORAL_TABLET | ORAL | Status: DC | PRN
  Administered 2019-07-05: 1 via ORAL
  Administered 2019-07-06: 2 via ORAL
  Administered 2019-07-06 (×2): 1 via ORAL
  Filled 2019-07-05 (×2): qty 1
  Filled 2019-07-05: qty 2
  Filled 2019-07-05: qty 1

## 2019-07-05 MED ORDER — THROMBIN 5000 UNITS EX SOLR
CUTANEOUS | Status: AC
  Filled 2019-07-05: qty 5000

## 2019-07-05 MED ORDER — PROPOFOL 10 MG/ML IV BOLUS
INTRAVENOUS | Status: DC | PRN
  Administered 2019-07-05: 200 mg via INTRAVENOUS

## 2019-07-05 MED ORDER — CEFAZOLIN SODIUM-DEXTROSE 2-4 GM/100ML-% IV SOLN
INTRAVENOUS | Status: AC
  Filled 2019-07-05: qty 100

## 2019-07-05 MED ORDER — ACETAMINOPHEN 325 MG PO TABS: 650.0000 mg | ORAL_TABLET | ORAL | Status: DC | PRN

## 2019-07-05 MED ORDER — ROCURONIUM BROMIDE 100 MG/10ML IV SOLN
INTRAVENOUS | Status: DC | PRN
  Administered 2019-07-05: 30 mg via INTRAVENOUS
  Administered 2019-07-05: 50 mg via INTRAVENOUS
  Administered 2019-07-05: 20 mg via INTRAVENOUS

## 2019-07-05 MED ORDER — SODIUM CHLORIDE 0.9 % IV SOLN: INTRAVENOUS | Status: DC

## 2019-07-05 MED ORDER — FAMOTIDINE 20 MG PO TABS
ORAL_TABLET | ORAL | Status: AC
  Administered 2019-07-05: 20 mg via ORAL
  Filled 2019-07-05: qty 1

## 2019-07-05 MED ORDER — HYDROMORPHONE HCL 1 MG/ML IJ SOLN
INTRAMUSCULAR | Status: AC
  Filled 2019-07-05: qty 1

## 2019-07-05 MED ORDER — ALLOPURINOL 100 MG PO TABS
300.0000 mg | ORAL_TABLET | Freq: Every day | ORAL | Status: DC
  Administered 2019-07-05 – 2019-07-06 (×2): 300 mg via ORAL
  Filled 2019-07-05 (×2): qty 3

## 2019-07-05 MED ORDER — HEPARIN SODIUM (PORCINE) 5000 UNIT/ML IJ SOLN
5000.0000 [IU] | Freq: Three times a day (TID) | INTRAMUSCULAR | Status: DC
  Administered 2019-07-06 (×2): 5000 [IU] via SUBCUTANEOUS
  Filled 2019-07-05 (×2): qty 1

## 2019-07-05 MED ORDER — PHENYLEPHRINE HCL (PRESSORS) 10 MG/ML IV SOLN: INTRAVENOUS | Status: DC | PRN

## 2019-07-05 MED ORDER — LISINOPRIL-HYDROCHLOROTHIAZIDE 20-25 MG PO TABS: 1.0000 | ORAL_TABLET | Freq: Every day | ORAL | Status: DC

## 2019-07-05 MED ORDER — ROCURONIUM BROMIDE 50 MG/5ML IV SOLN
INTRAVENOUS | Status: AC
  Filled 2019-07-05: qty 2

## 2019-07-05 MED ORDER — BUPIVACAINE HCL 0.5 % IJ SOLN
INTRAMUSCULAR | Status: DC | PRN
  Administered 2019-07-05: 30 mL

## 2019-07-05 MED ORDER — HYDROCHLOROTHIAZIDE 25 MG PO TABS
25.0000 mg | ORAL_TABLET | Freq: Every day | ORAL | Status: DC
  Administered 2019-07-05 – 2019-07-06 (×2): 25 mg via ORAL
  Filled 2019-07-05 (×2): qty 1

## 2019-07-05 MED ORDER — ONDANSETRON HCL 4 MG/2ML IJ SOLN
INTRAMUSCULAR | Status: DC | PRN
  Administered 2019-07-05: 4 mg via INTRAVENOUS

## 2019-07-05 MED ORDER — FENTANYL CITRATE (PF) 100 MCG/2ML IJ SOLN
INTRAMUSCULAR | Status: DC | PRN
  Administered 2019-07-05 (×2): 50 ug via INTRAVENOUS

## 2019-07-05 MED ORDER — BELLADONNA ALKALOIDS-OPIUM 16.2-60 MG RE SUPP
RECTAL | Status: AC
  Administered 2019-07-05: 1 via RECTAL
  Filled 2019-07-05: qty 1

## 2019-07-05 MED ORDER — MORPHINE SULFATE (PF) 2 MG/ML IV SOLN
2.0000 mg | INTRAVENOUS | Status: DC | PRN
  Administered 2019-07-05 – 2019-07-06 (×2): 2 mg via INTRAVENOUS
  Filled 2019-07-05 (×2): qty 1

## 2019-07-05 MED ORDER — EPHEDRINE SULFATE 50 MG/ML IJ SOLN
INTRAMUSCULAR | Status: DC | PRN
  Administered 2019-07-05: 5 mg via INTRAVENOUS

## 2019-07-05 MED ORDER — HYDROMORPHONE HCL 1 MG/ML IJ SOLN
INTRAMUSCULAR | Status: DC | PRN
  Administered 2019-07-05: .5 mg via INTRAVENOUS
  Administered 2019-07-05: .2 mg via INTRAVENOUS

## 2019-07-05 MED ORDER — DIPHENHYDRAMINE HCL 12.5 MG/5ML PO ELIX
12.5000 mg | ORAL_SOLUTION | Freq: Four times a day (QID) | ORAL | Status: DC | PRN
  Filled 2019-07-05: qty 5

## 2019-07-05 MED ORDER — BUPIVACAINE HCL (PF) 0.5 % IJ SOLN
INTRAMUSCULAR | Status: AC
  Filled 2019-07-05: qty 30

## 2019-07-05 MED ORDER — MIDAZOLAM HCL 2 MG/2ML IJ SOLN
INTRAMUSCULAR | Status: DC | PRN
  Administered 2019-07-05: 2 mg via INTRAVENOUS

## 2019-07-05 MED ORDER — CEFAZOLIN SODIUM-DEXTROSE 2-4 GM/100ML-% IV SOLN
2.0000 g | INTRAVENOUS | Status: AC
  Administered 2019-07-05: 2 g via INTRAVENOUS

## 2019-07-05 MED ORDER — KETOROLAC TROMETHAMINE 30 MG/ML IJ SOLN
INTRAMUSCULAR | Status: DC | PRN
  Administered 2019-07-05: 30 mg via INTRAVENOUS

## 2019-07-05 MED ORDER — PROMETHAZINE HCL 25 MG/ML IJ SOLN: 6.2500 mg | INTRAMUSCULAR | Status: DC | PRN

## 2019-07-05 MED ORDER — SEVOFLURANE IN SOLN
RESPIRATORY_TRACT | Status: AC
  Filled 2019-07-05: qty 250

## 2019-07-05 MED ORDER — ONDANSETRON HCL 4 MG/2ML IJ SOLN
INTRAMUSCULAR | Status: AC
  Filled 2019-07-05: qty 2

## 2019-07-05 MED ORDER — MELATONIN 5 MG PO TABS
5.0000 mg | ORAL_TABLET | Freq: Every day | ORAL | Status: DC
  Administered 2019-07-06: 5 mg via ORAL
  Filled 2019-07-05: qty 1

## 2019-07-05 SURGICAL SUPPLY — 102 items
ANCHOR TIS RET SYS 235ML (MISCELLANEOUS) ×3 IMPLANT
APPLICATOR SURGIFLO ENDO (HEMOSTASIS) ×3 IMPLANT
APPLIER CLIP LOGIC TI 5 (MISCELLANEOUS) IMPLANT
BAG URINE DRAIN 2000ML AR STRL (UROLOGICAL SUPPLIES) ×3 IMPLANT
BLADE CLIPPER SURG (BLADE) ×3 IMPLANT
BULB RESERV EVAC DRAIN JP 100C (MISCELLANEOUS) ×3 IMPLANT
CANISTER SUCT 1200ML W/VALVE (MISCELLANEOUS) ×3 IMPLANT
CATH DRAINAGE MALECOT 26FR (CATHETERS) ×1 IMPLANT
CATH FOL 2WAY LX 18X30 (CATHETERS) ×3 IMPLANT
CATH FOL 2WAY LX 18X5 (CATHETERS) ×6 IMPLANT
CATH MALECOT (CATHETERS) ×3
CHLORAPREP W/TINT 26 (MISCELLANEOUS) ×6 IMPLANT
CLIP SUT LAPRA TY ABSORB (SUTURE) IMPLANT
CLIP VESOLOCK LG 6/CT PURPLE (CLIP) ×12 IMPLANT
CORD BIP STRL DISP 12FT (MISCELLANEOUS) ×3 IMPLANT
CORD MONOPOLAR M/FML 12FT (MISCELLANEOUS) IMPLANT
COVER TIP SHEARS 8 DVNC (MISCELLANEOUS) ×1 IMPLANT
COVER TIP SHEARS 8MM DA VINCI (MISCELLANEOUS) ×2
COVER WAND RF STERILE (DRAPES) ×3 IMPLANT
DEFOGGER SCOPE WARMER CLEARIFY (MISCELLANEOUS) ×3 IMPLANT
DERMABOND ADVANCED (GAUZE/BANDAGES/DRESSINGS) ×2
DERMABOND ADVANCED .7 DNX12 (GAUZE/BANDAGES/DRESSINGS) ×1 IMPLANT
DRAIN CHANNEL JP 15F RND 16 (MISCELLANEOUS) IMPLANT
DRAIN CHANNEL JP 19F (MISCELLANEOUS) ×3 IMPLANT
DRAPE 3/4 80X56 (DRAPES) ×3 IMPLANT
DRAPE COLUMN DVNC XI (DISPOSABLE) ×1 IMPLANT
DRAPE DA VINCI XI COLUMN (DISPOSABLE) ×2
DRAPE LEGGINS SURG 28X43 STRL (DRAPES) ×3 IMPLANT
DRAPE SURG 17X11 SM STRL (DRAPES) ×12 IMPLANT
DRAPE UNDER BUTTOCK W/FLU (DRAPES) ×3 IMPLANT
DRIVER LRG NEEDLE DA VINCI (INSTRUMENTS)
DRIVER NDLE LRG DVNC (INSTRUMENTS) IMPLANT
DRSG TELFA 3X8 NADH (GAUZE/BANDAGES/DRESSINGS) ×3 IMPLANT
ELECT REM PT RETURN 9FT ADLT (ELECTROSURGICAL) ×3
ELECTRODE REM PT RTRN 9FT ADLT (ELECTROSURGICAL) ×1 IMPLANT
FORCEPS MARYLAND BIPOLAR 8X55 (INSTRUMENTS) ×2
FORCEPS MRYLND BPLR 8X55 DVNC (INSTRUMENTS) ×1 IMPLANT
GLOVE BIO SURGEON STRL SZ 6.5 (GLOVE) ×6 IMPLANT
GLOVE BIO SURGEONS STRL SZ 6.5 (GLOVE) ×3
GLOVE BIOGEL PI IND STRL 7.5 (GLOVE) ×1 IMPLANT
GLOVE BIOGEL PI INDICATOR 7.5 (GLOVE) ×2
GOWN STRL REUS W/ TWL LRG LVL3 (GOWN DISPOSABLE) ×3 IMPLANT
GOWN STRL REUS W/ TWL XL LVL3 (GOWN DISPOSABLE) ×1 IMPLANT
GOWN STRL REUS W/TWL LRG LVL3 (GOWN DISPOSABLE) ×6
GOWN STRL REUS W/TWL XL LVL3 (GOWN DISPOSABLE) ×2
GRASPER SUT TROCAR 14GX15 (MISCELLANEOUS) ×3 IMPLANT
HEMOSTAT SURGICEL 2X14 (HEMOSTASIS) ×3 IMPLANT
HOLDER FOLEY CATH W/STRAP (MISCELLANEOUS) ×3 IMPLANT
IRRIGATION STRYKERFLOW (MISCELLANEOUS) ×1 IMPLANT
IRRIGATOR STRYKERFLOW (MISCELLANEOUS) ×3
IV LACTATED RINGERS 1000ML (IV SOLUTION) ×3 IMPLANT
KIT PINK PAD W/HEAD ARE REST (MISCELLANEOUS) ×3
KIT PINK PAD W/HEAD ARM REST (MISCELLANEOUS) ×1 IMPLANT
LABEL OR SOLS (LABEL) ×3 IMPLANT
MARKER SKIN DUAL TIP RULER LAB (MISCELLANEOUS) ×3 IMPLANT
NEEDLE HYPO 22GX1.5 SAFETY (NEEDLE) ×3 IMPLANT
NEEDLE INSUFFLATION 14GA 120MM (NEEDLE) ×3 IMPLANT
NS IRRIG 500ML POUR BTL (IV SOLUTION) ×3 IMPLANT
OBTURATOR OPTICAL STANDARD 8MM (TROCAR) ×2
OBTURATOR OPTICAL STND 8 DVNC (TROCAR) ×1
OBTURATOR OPTICALSTD 8 DVNC (TROCAR) ×1 IMPLANT
PACK LAP CHOLECYSTECTOMY (MISCELLANEOUS) ×3 IMPLANT
PENCIL ELECTRO HAND CTR (MISCELLANEOUS) ×3 IMPLANT
PROGRASP ENDOWRIST DA VINCI (INSTRUMENTS)
PROGRASP ENDOWRIST DVNC (INSTRUMENTS) IMPLANT
RELOAD STAPLER WHITE 60MM (STAPLE) ×1 IMPLANT
SEAL CANN UNIV 5-8 DVNC XI (MISCELLANEOUS) ×4 IMPLANT
SEAL XI 5MM-8MM UNIVERSAL (MISCELLANEOUS) ×8
SET TUBE SMOKE EVAC HIGH FLOW (TUBING) ×3 IMPLANT
SLEEVE ENDOPATH XCEL 5M (ENDOMECHANICALS) ×6 IMPLANT
SOLUTION ELECTROLUBE (MISCELLANEOUS) ×3 IMPLANT
SPOGE SURGIFLO 8M (HEMOSTASIS) ×2
SPONGE LAP 4X18 RFD (DISPOSABLE) ×3 IMPLANT
SPONGE SURGIFLO 8M (HEMOSTASIS) ×1 IMPLANT
SPONGE VERSALON 4X4 4PLY (MISCELLANEOUS) IMPLANT
STAPLE ECHEON FLEX 60 POW ENDO (STAPLE) ×3 IMPLANT
STAPLER RELOAD WHITE 60MM (STAPLE) ×3
STAPLER SKIN PROX 35W (STAPLE) ×3 IMPLANT
STRAP SAFETY 5IN WIDE (MISCELLANEOUS) ×3 IMPLANT
SURGILUBE 2OZ TUBE FLIPTOP (MISCELLANEOUS) ×3 IMPLANT
SUT DVC VLOC 90 3-0 CV23 UNDY (SUTURE) ×3 IMPLANT
SUT DVC VLOC 90 3-0 CV23 VLT (SUTURE) ×3
SUT ETHILON 3-0 FS-10 30 BLK (SUTURE)
SUT MNCRL 4-0 (SUTURE) ×4
SUT MNCRL 4-0 27XMFL (SUTURE) ×2
SUT PROLENE 5 0 RB 1 DA (SUTURE) IMPLANT
SUT SILK 2 0 SH (SUTURE) ×3 IMPLANT
SUT VIC AB 0 CT1 36 (SUTURE) ×6 IMPLANT
SUT VIC AB 2-0 CT1 (SUTURE) ×6 IMPLANT
SUT VIC AB 2-0 SH 27 (SUTURE) ×4
SUT VIC AB 2-0 SH 27XBRD (SUTURE) ×2 IMPLANT
SUT VICRYL 0 AB UR-6 (SUTURE) ×3 IMPLANT
SUTURE DVC VLC 90 3-0 CV23 VLT (SUTURE) ×1 IMPLANT
SUTURE EHLN 3-0 FS-10 30 BLK (SUTURE) IMPLANT
SUTURE MNCRL 4-0 27XMF (SUTURE) ×2 IMPLANT
SYR 10ML LL (SYRINGE) ×3 IMPLANT
SYR BULB IRRIG 60ML STRL (SYRINGE) ×3 IMPLANT
SYRINGE IRR TOOMEY STRL 70CC (SYRINGE) ×6 IMPLANT
TAPE CLOTH 3X10 WHT NS LF (GAUZE/BANDAGES/DRESSINGS) ×6 IMPLANT
TROCAR ENDOPATH XCEL 12X100 BL (ENDOMECHANICALS) ×6 IMPLANT
TROCAR XCEL 12X100 BLDLESS (ENDOMECHANICALS) ×3 IMPLANT
TROCAR XCEL NON-BLD 5MMX100MML (ENDOMECHANICALS) ×3 IMPLANT

## 2019-07-05 NOTE — Anesthesia Postprocedure Evaluation (Signed)
Anesthesia Post Note  Patient: Jeff Rice  Procedure(s) Performed: XI ROBOTIC ASSISTED LAPAROSCOPIC RADICAL PROSTATECTOMY WITH LYMPH NODE DISSECTION (N/A )  Patient location during evaluation: PACU Anesthesia Type: General Level of consciousness: awake and alert Pain management: pain level controlled Vital Signs Assessment: post-procedure vital signs reviewed and stable Respiratory status: spontaneous breathing, nonlabored ventilation, respiratory function stable and patient connected to nasal cannula oxygen Cardiovascular status: blood pressure returned to baseline and stable Postop Assessment: no apparent nausea or vomiting Anesthetic complications: no     Last Vitals:  Vitals:   07/05/19 1555 07/05/19 1600  BP: 134/81   Pulse: 66 66  Resp: 13 17  Temp:    SpO2: 97% 98%    Last Pain:  Vitals:   07/05/19 1600  TempSrc:   PainSc: Asleep                 Arita Miss

## 2019-07-05 NOTE — Transfer of Care (Signed)
Immediate Anesthesia Transfer of Care Note  Patient: Jeff Rice  Procedure(s) Performed: XI ROBOTIC ASSISTED LAPAROSCOPIC RADICAL PROSTATECTOMY WITH LYMPH NODE DISSECTION (N/A )  Patient Location: PACU  Anesthesia Type:General  Level of Consciousness: sedated  Airway & Oxygen Therapy: Patient Spontanous Breathing and Patient connected to face mask oxygen  Post-op Assessment: Report given to RN and Post -op Vital signs reviewed and stable  Post vital signs: Reviewed and stable  Last Vitals:  Vitals Value Taken Time  BP 124/64 07/05/19 1523  Temp 36.2 C 07/05/19 1523  Pulse 78 07/05/19 1523  Resp 16 07/05/19 1523  SpO2 97 % 07/05/19 1523  Vitals shown include unvalidated device data.  Last Pain:  Vitals:   07/05/19 1058  TempSrc: Temporal  PainSc: 0-No pain         Complications: No apparent anesthesia complications

## 2019-07-05 NOTE — Interval H&P Note (Signed)
History and Physical Interval Note:  07/05/2019 11:14 AM  Jeff Rice  has presented today for surgery, with the diagnosis of prostate cancer.  The various methods of treatment have been discussed with the patient and family. After consideration of risks, benefits and other options for treatment, the patient has consented to  Procedure(s): XI ROBOTIC [place] (N/A) as a surgical intervention.  The patient's history has been reviewed, patient examined, no change in status, stable for surgery.  I have reviewed the patient's chart and labs.  Questions were answered to the patient's satisfaction.    RRR CTAB    Hollice Espy

## 2019-07-05 NOTE — Anesthesia Procedure Notes (Signed)
Procedure Name: Intubation Date/Time: 07/05/2019 12:15 PM Performed by: Aline Brochure, CRNA Pre-anesthesia Checklist: Patient identified, Emergency Drugs available, Suction available and Patient being monitored Patient Re-evaluated:Patient Re-evaluated prior to induction Oxygen Delivery Method: Circle system utilized Preoxygenation: Pre-oxygenation with 100% oxygen Induction Type: IV induction Ventilation: Mask ventilation without difficulty Laryngoscope Size: McGraph and 4 Grade View: Grade I Tube type: Oral Tube size: 7.5 mm Number of attempts: 2 Airway Equipment and Method: Stylet and Video-laryngoscopy Placement Confirmation: ETT inserted through vocal cords under direct vision,  positive ETCO2 and breath sounds checked- equal and bilateral Secured at: 22 cm Tube secured with: Tape Dental Injury: Teeth and Oropharynx as per pre-operative assessment

## 2019-07-05 NOTE — Anesthesia Preprocedure Evaluation (Signed)
Anesthesia Evaluation  Patient identified by MRN, date of birth, ID band Patient awake    Reviewed: Allergy & Precautions, H&P , NPO status , Patient's Chart, lab work & pertinent test results, reviewed documented beta blocker date and time   History of Anesthesia Complications (+) PONV and history of anesthetic complications  Airway Mallampati: II  TM Distance: >3 FB Neck ROM: full    Dental  (+) Teeth Intact, Dental Advidsory Given, Caps   Pulmonary neg pulmonary ROS,    Pulmonary exam normal        Cardiovascular Exercise Tolerance: Good hypertension, (-) angina(-) Past MI and (-) Cardiac Stents Normal cardiovascular exam(-) dysrhythmias (-) Valvular Problems/Murmurs     Neuro/Psych negative neurological ROS  negative psych ROS   GI/Hepatic negative GI ROS, Neg liver ROS,   Endo/Other  negative endocrine ROS  Renal/GU negative Renal ROS  negative genitourinary   Musculoskeletal   Abdominal   Peds  Hematology negative hematology ROS (+)   Anesthesia Other Findings Past Medical History: No date: BPH (benign prostatic hyperplasia) No date: ED (erectile dysfunction) No date: HLD (hyperlipidemia) No date: Hypertension No date: Rising PSA level   Reproductive/Obstetrics negative OB ROS                             Anesthesia Physical Anesthesia Plan  ASA: II  Anesthesia Plan: General   Post-op Pain Management:    Induction: Intravenous  PONV Risk Score and Plan: 3 and Ondansetron, Dexamethasone, Midazolam and Treatment may vary due to age or medical condition  Airway Management Planned: Oral ETT  Additional Equipment:   Intra-op Plan:   Post-operative Plan: Extubation in OR  Informed Consent: I have reviewed the patients History and Physical, chart, labs and discussed the procedure including the risks, benefits and alternatives for the proposed anesthesia with the patient or  authorized representative who has indicated his/her understanding and acceptance.     Dental Advisory Given  Plan Discussed with: Anesthesiologist, CRNA and Surgeon  Anesthesia Plan Comments:         Anesthesia Quick Evaluation

## 2019-07-05 NOTE — Op Note (Signed)
07/05/19  PREOPERATIVE DIAGNOSIS: Prostate cancer.  POSTOPERATIVE DIAGNOSIS: Prostate cancer.  OPERATION PERFORMED: 1. DaVinci laparoscopic radical prostatectomy (bilateral partial nerve sparing) 2 DaVinci laproscopic bilateral pelvic lymph node dissection.  SURGEON: Hollice Espy, MD  ASSISTANTS: Nickolas Madrid, MD  ANESTHESIA: General.  EBL: 50 cc  SPECIMEN: Prostate with bilateral seminal vesicals, bilateral pelvic lymph nodes, anterior fat pad.  FINDINGS: Accessory Pudendal Vessel: none  INDICATION: Pt.is a 54 year old male with Gleason 3+4 prostate cancer. Treatment options were discussed with him at length and he chose DaVinci radical prostatectomy.   Moderate baseline erectile dysfunction therefore plan for at least partial nerve sparing was discussed. Bilateral pelvic lymph node dissection was planned due to his risk stratification.  PROCEDURE IN DETAIL: Patient was given Ancef preoperatively. He had sequential compression devices applied preoperatively for DVT prophylaxis. He was taken to the operating room where he was induced with general anesthesia. After adequate anesthesia, he was placed in the dorsal lithotomy position. His arms were draped by his side and was appropriately padded and secured to the operating room table. He was placed in the Trendelenburg position.  He was prepped and draped in sterile fashion. An 94 French Foley was placed in the bladder and instilled with 15 cc sterile water. Orogastric tube was placed. The Veress needle was passed just above the umbilicus and the abdomen was insufflated to 15 atmospheres. An 69mm, blunt-tip trocar was placed just above the umbilicus. The zero-degree camera was passed within this and the following trocars were placed under direct vision; 8 mm robotic trocars were placed 9 cm laterally and inferiorly to the initially placed umbilical trocar. A third one was placed 7 cm lateral to the left-sided  trocar. In the corresponding position on the right side, a 12 mm trocar was placed, and then a 5 mm trocar was placed to the right and well above the umbilicus.  The 12 mm trocar site was preclosed using a 0 Vicryl suture using the Carter-Thomason which is secured throughout the procedure and tied down at the end for fascial closure.  The robot was then docked with the robot trocar. I used the zero-degree camera. I had the hot scissors in the right hand and the left hand with the [place] bipolar and far left hand the Prograsp forceps.  Patient had an extremely redundant sigmoid with some adhesions to the left pelvic sidewall which had to be taken down.  Additionally, he had a left inguinal hernia which was reduced and the hernia sac was transected once it was adequately done so.  Care was taken to achieve adequate hemostasis.  Initially I divided the median umbilical ligament bilaterally and the urachus and developed the space of Retzius down to pubic bone. I divided the parietal peritoneum laterally up to the vas deferens on each side. I used the prograsp forceps to provide cranial traction on the urachus. I cleaned off the Endopelvic fascia on each side and then divided it with the scissors laterally to the perirectal fat and medially to the puboprostatic ligaments which were divided. I then ligated the dorsal vein complex using a 60 mm vascular load stapler.   I then addressed the bladder neck with a 30-degree down lens. I identified the bladder neck by pulling on the Foley catheter. I divided the anterior bladder neck musculature until I then found the anterior bladder neck mucosa which was incised. I identified the Foley catheter within, deflated the balloon, pulled the Foley out through this opening and then using the Carter-Thomason  needle with a #0-Vicryl suture, passed through The suprapubic region and pulled the suture through the eye of the Foley and then back out. This allowed  me to provide upward traction on the prostate. I then divided the lateral bladder neck mucosa and the posterior bladder neck mucosa. I was well away from ureteral orifices. I divided the posterior bladder neck musculature until I identified the vas deferens. They were freed proximally, then divided. I freed up the seminal vesicals using blunt and sharp dissection.   I then went back to the 0-degree lens. I divided the Denonvilliers fascia beneath the prostate and developed the prostate off the rectum. I then did a bilateral partial nerve sparing by  creating a plane which was intrafascial. I then isolated the pedicles of the prostate and placed weck clips on the pedicles of the prostate and then divided it with cold scissors. I continued to divide the neurovascular bundles off the prostate out to the apex of the prostate. At this point the prostate was freed up except for the urethra. I addressed the prostate anteriorly, divided the dorsal vein , then the anterior urethral wall, pulled the Foley catheter back and then divided the posterior urethral wall. Specimen was completely freed up. I placed the prostate in an Endo catch bag and then placed the bag in the upper abdomen out of the way. I then irrigated the pelvis. The rectal test was negative. There was reasonable hemostasis.  I then did the pelvic lymph node dissection by incising the fascia overlying the right external iliac vein, dissecting distally. I went just distal to the node of [place] where we placed clips and then divided the lymphatics. The lateral aspect of the dissection was the pelvic side wall, inferior was the obturator nerve and proximal the hypogastric vessels. I placed clips at the proximal aspect and then divided the lymphatics. This was removed with the spoon grasper and sent to pathology.   I then did the left obturator lymph node dissection in the same fashion as the left side.  With good hemostasis, I  then did the posterior reconstruction. I used a 3-0 VLoc suture on an RB1 through the cut edge of Denonvilliers fascia beneath the bladder on the right side and through the posterior striated sphincter underneath the urethra. This brought the bladder neck and urethra and closer proximity to help facilitate anastomosis.   I then did the urethral vesicle anastomosis again with two 3-0 VLoc sutures on an RB1 needle interlocked. I passed both ends of the suture from the outside-in through the bladder neck at the 6 o'clock position. I passed both through the urethral stump from the inside-out in the corresponding position. I reapproximated the bladder neck to the urethra. I then ran the Left suture on the left side anastomosis to the 9 o'clock position. Then I went back to the right sided suture and ran that up the right side to the 12 o'clock position. I then continued the left suture to the 12 o'clock position.The suture was then suspended anteriorly behind the pubic bone.   I then placed a new 33 French Foley into the bladder and filled it with 10 cc sterile water. I irrigated the bladder with 160 cc. There is very small leak from the left posterior lateral side.  As such, a 19 round Blake drain was placed in the dependent pelvis.  There was reasonable hemostasis.  Surgicel was used on either side of the pedicles for an additional hemostasis.  The  instruments were then removed. The robot was undocked and all the trocars were removed under direct vision. There was good hemostasis. I then enlarged the umbilical trocar site large enough to remove the prostate and I closed the fascia here with #0-0 Vicryl suture in a running fashion. All the port sites were irrigated. Lidocaine was injected into all the trocar sites. The skin was closed with 4-0 Monocryl in running subcuticular fashion. Dermabond was applied.   At this point patient was awakened and extubated in the operating room and  taken to the recovery room in stable condition. There were no complications. All counts correct.  Hollice Espy, MD

## 2019-07-06 DIAGNOSIS — C61 Malignant neoplasm of prostate: Secondary | ICD-10-CM | POA: Diagnosis not present

## 2019-07-06 LAB — BASIC METABOLIC PANEL
Anion gap: 11 (ref 5–15)
BUN: 17 mg/dL (ref 6–20)
CO2: 22 mmol/L (ref 22–32)
Calcium: 8.3 mg/dL — ABNORMAL LOW (ref 8.9–10.3)
Chloride: 103 mmol/L (ref 98–111)
Creatinine, Ser: 1.05 mg/dL (ref 0.61–1.24)
GFR calc Af Amer: >60 mL/min (ref >60)
GFR calc non Af Amer: >60 mL/min (ref >60)
Glucose, Bld: 128 mg/dL — ABNORMAL HIGH (ref 70–99)
Potassium: 3.7 mmol/L (ref 3.5–5.1)
Sodium: 136 mmol/L (ref 135–145)

## 2019-07-06 LAB — CREATININE, FLUID (PLEURAL, PERITONEAL, JP DRAINAGE): Creat, Fluid: 14.7 mg/dL

## 2019-07-06 LAB — HEMOGLOBIN AND HEMATOCRIT, BLOOD
HCT: 38.5 % — ABNORMAL LOW (ref 39.0–52.0)
Hemoglobin: 13.2 g/dL (ref 13.0–17.0)

## 2019-07-06 MED ORDER — OXYBUTYNIN CHLORIDE 5 MG PO TABS: 5.0000 mg | ORAL_TABLET | Freq: Three times a day (TID) | ORAL | 0 refills | Status: DC | PRN

## 2019-07-06 MED ORDER — DOCUSATE SODIUM 100 MG PO CAPS: 100.0000 mg | ORAL_CAPSULE | Freq: Two times a day (BID) | ORAL | 0 refills | Status: AC

## 2019-07-06 MED ORDER — CHLORHEXIDINE GLUCONATE CLOTH 2 % EX PADS
6.0000 | MEDICATED_PAD | Freq: Every day | CUTANEOUS | Status: DC
  Administered 2019-07-06: 6 via TOPICAL

## 2019-07-06 MED ORDER — OXYCODONE-ACETAMINOPHEN 5-325 MG PO TABS: 1.0000 | ORAL_TABLET | Freq: Four times a day (QID) | ORAL | 0 refills | Status: DC | PRN

## 2019-07-06 NOTE — Discharge Instructions (Signed)
Please make sure to keep track of your drain output each day.

## 2019-07-06 NOTE — Plan of Care (Signed)
The patient has been discharged. Foley teaching performing. The patient has been discharged with a leg bag. IV removed.  Problem: Education: Goal: Knowledge of General Education information will improve Description: Including pain rating scale, medication(s)/side effects and non-pharmacologic comfort measures 07/06/2019 1645 by Myles Rosenthal, Cory Roughen, RN Outcome: Completed/Met 07/06/2019 1208 by Myles Rosenthal, Cory Roughen, RN Outcome: Progressing   Problem: Health Behavior/Discharge Planning: Goal: Ability to manage health-related needs will improve 07/06/2019 1645 by Myles Rosenthal, Cory Roughen, RN Outcome: Completed/Met 07/06/2019 1208 by Myles Rosenthal, Cory Roughen, RN Outcome: Progressing   Problem: Clinical Measurements: Goal: Ability to maintain clinical measurements within normal limits will improve 07/06/2019 1645 by Myles Rosenthal, Cory Roughen, RN Outcome: Completed/Met 07/06/2019 1208 by Myles Rosenthal, Cory Roughen, RN Outcome: Progressing Goal: Will remain free from infection 07/06/2019 1645 by Myles Rosenthal, Cory Roughen, RN Outcome: Completed/Met 07/06/2019 [place] by Myles Rosenthal, Cory Roughen, RN Outcome: Progressing Goal: Diagnostic test results will improve 07/06/2019 1645 by Ronna Polio, RN Outcome: Completed/Met 07/06/2019 [place] by Myles Rosenthal, Cory Roughen, RN Outcome: Progressing Goal: Respiratory complications will improve 07/06/2019 1645 by Ronna Polio, RN Outcome: Completed/Met 07/06/2019 [place] by Myles Rosenthal, Cory Roughen, RN Outcome: Progressing Goal: Cardiovascular complication will be avoided 07/06/2019 1645 by Ronna Polio, RN Outcome: Completed/Met 07/06/2019 1208 by Ronna Polio, RN Outcome: Progressing   Problem: Activity: Goal: Risk for activity intolerance will decrease 07/06/2019 1645 by Myles Rosenthal, Cory Roughen, RN Outcome: Completed/Met 07/06/2019 1208 by Myles Rosenthal, Cory Roughen, RN Outcome: Progressing   Problem: Nutrition: Goal: Adequate nutrition  will be maintained 07/06/2019 1645 by Myles Rosenthal, Cory Roughen, RN Outcome: Completed/Met 07/06/2019 1208 by Myles Rosenthal, Cory Roughen, RN Outcome: Progressing   Problem: Coping: Goal: Level of anxiety will decrease 07/06/2019 1645 by Myles Rosenthal, Cory Roughen, RN Outcome: Completed/Met 07/06/2019 1208 by Myles Rosenthal, Cory Roughen, RN Outcome: Progressing   Problem: Elimination: Goal: Will not experience complications related to bowel motility 07/06/2019 1645 by Ronna Polio, RN Outcome: Completed/Met 07/06/2019 1208 by Myles Rosenthal, Cory Roughen, RN Outcome: Progressing Goal: Will not experience complications related to urinary retention 07/06/2019 1645 by Ronna Polio, RN Outcome: Completed/Met 07/06/2019 1208 by Myles Rosenthal, Cory Roughen, RN Outcome: Progressing   Problem: Pain Managment: Goal: General experience of comfort will improve 07/06/2019 1645 by Ronna Polio, RN Outcome: Completed/Met 07/06/2019 1208 by Ronna Polio, RN Outcome: Progressing

## 2019-07-06 NOTE — Plan of Care (Signed)
The patient has been ambulating. No falls. IV fluids have been d/c'ed. Pain can suddenly increase to being a 6/10 quickly PRN pain meds given.   Problem: Education: Goal: Knowledge of General Education information will improve Description: Including pain rating scale, medication(s)/side effects and non-pharmacologic comfort measures Outcome: Progressing   Problem: Health Behavior/Discharge Planning: Goal: Ability to manage health-related needs will improve Outcome: Progressing   Problem: Clinical Measurements: Goal: Ability to maintain clinical measurements within normal limits will improve Outcome: Progressing Goal: Will remain free from infection Outcome: Progressing Goal: Diagnostic test results will improve Outcome: Progressing Goal: Respiratory complications will improve Outcome: Progressing Goal: Cardiovascular complication will be avoided Outcome: Progressing   Problem: Activity: Goal: Risk for activity intolerance will decrease Outcome: Progressing   Problem: Nutrition: Goal: Adequate nutrition will be maintained Outcome: Progressing   Problem: Coping: Goal: Level of anxiety will decrease Outcome: Progressing   Problem: Elimination: Goal: Will not experience complications related to bowel motility Outcome: Progressing Goal: Will not experience complications related to urinary retention Outcome: Progressing   Problem: Pain Managment: Goal: General experience of comfort will improve Outcome: Progressing   Problem: Safety: Goal: Ability to remain free from injury will improve Outcome: Progressing   Problem: Skin Integrity: Goal: Risk for impaired skin integrity will decrease Outcome: Progressing

## 2019-07-06 NOTE — Progress Notes (Addendum)
Urology Inpatient Progress Note  Subjective: Jeff Rice is a 54 y.o. male with Gleason 3+4 prostate cancer admitted on 07/05/2019 for scheduled robotic assisted laparoscopic radical prostatectomy, bilateral partial nerve sparing, and bilateral pelvic lymph node dissection with Dr. Erlene Quan.  He was found to have a small anastomotic leak intraoperatively and underwent placement of a 19 round Blake drain in the dependent pelvis.  Drain creatinine 14.7. Creatinine and hemoglobin WNL.  Hematocrit borderline low.  He has been ambulatory today, denies nausea and vomiting, and has passed flatus.  Diet advanced this morning.  He reports some breakthrough pain this morning that has since been controlled.  Anti-infectives: Anti-infectives (From admission, onward)   Start     Dose/Rate Route Frequency Ordered Stop   07/05/19 2000  ceFAZolin (ANCEF) IVPB 1 g/50 mL premix     1 g 100 mL/hr over 30 Minutes Intravenous Every 8 hours 07/05/19 1658 07/06/19 0604   07/05/19 1107  ceFAZolin (ANCEF) 2-4 GM/100ML-% IVPB    Note to Pharmacy: Leonia Reader   : cabinet override      07/05/19 1107 07/05/19 1235   07/05/19 0955  ceFAZolin (ANCEF) IVPB 2g/100 mL premix     2 g 200 mL/hr over 30 Minutes Intravenous 30 min pre-op 07/05/19 0955 07/05/19 1220      Current Facility-Administered Medications  Medication Dose Route Frequency Provider Last Rate Last Admin  . acetaminophen (TYLENOL) tablet 650 mg  650 mg Oral Q4H PRN Hollice Espy, MD      . allopurinol (ZYLOPRIM) tablet 300 mg  300 mg Oral Daily Hollice Espy, MD   300 mg at 07/05/19 1800  . Chlorhexidine Gluconate Cloth 2 % PADS 6 each  6 each Topical Daily Hollice Espy, MD      . diphenhydrAMINE (BENADRYL) injection 12.5 mg  12.5 mg Intravenous Q6H PRN Hollice Espy, MD       Or  . diphenhydrAMINE (BENADRYL) 12.5 MG/5ML elixir 12.5 mg  12.5 mg Oral Q6H PRN Hollice Espy, MD      . docusate sodium (COLACE) capsule 100 mg  100 mg Oral  BID Hollice Espy, MD   100 mg at 07/06/19 0009  . heparin injection 5,000 Units  5,000 Units Subcutaneous Q8H Hollice Espy, MD   5,000 Units at 07/06/19 0520  . lisinopril (ZESTRIL) tablet 20 mg  20 mg Oral Daily Hollice Espy, MD   20 mg at 07/05/19 1807   And  . hydrochlorothiazide (HYDRODIURIL) tablet 25 mg  25 mg Oral Daily Hollice Espy, MD   25 mg at 07/05/19 1807  . Melatonin TABS 5 mg  5 mg Oral QHS Hollice Espy, MD   5 mg at 07/06/19 0009  . morphine 2 MG/ML injection 2-4 mg  2-4 mg Intravenous Q2H PRN Hollice Espy, MD   2 mg at 07/06/19 0657  . ondansetron (ZOFRAN) injection 4 mg  4 mg Intravenous Q4H PRN Hollice Espy, MD      . opium-belladonna (B&O SUPPRETTES) 16.2-60 MG suppository 1 suppository  1 suppository Rectal Q6H PRN Hollice Espy, MD   1 suppository at 07/05/19 1535  . oxybutynin (DITROPAN) tablet 5 mg  5 mg Oral Q8H PRN Hollice Espy, MD      . oxyCODONE-acetaminophen (PERCOCET/ROXICET) 5-325 MG per tablet 1-2 tablet  1-2 tablet Oral Q4H PRN Hollice Espy, MD   1 tablet at 07/06/19 0020     Objective: Vital signs in last 24 hours: Temp:  [97 F (36.1 C)-99.1 F (37.3 C)] 98.2 F (36.8 C) (  02/02 0526) Pulse Rate:  [53-81] 81 (02/02 0526) Resp:  [10-26] 18 (02/02 0526) BP: (124-151)/(63-109) 126/70 (02/02 0526) SpO2:  [91 %-100 %] 94 % (02/02 0526) Weight:  [108.4 kg] 108.4 kg (02/01 1058)  Intake/Output from previous day: 02/01 0701 - 02/02 0700 In: 2550.4 [P.O.:170; I.V.:2330.4; IV Piggyback:50] Out: [id number] [Urine:1325; Drains:50; Blood:50] Intake/Output this shift: No intake/output data recorded.  Physical Exam Vitals and nursing note reviewed.  Constitutional:      General: He is not in acute distress.    Appearance: He is not ill-appearing, toxic-appearing or diaphoretic.  HENT:     Head: Normocephalic and atraumatic.  Pulmonary:     Effort: Pulmonary effort is normal. No respiratory distress.  Abdominal:     General: There  is no distension.     Palpations: Abdomen is soft.     Tenderness: There is no guarding or rebound.  Skin:    General: Skin is warm and dry.  Neurological:     Mental Status: He is alert and oriented to person, place, and time.  Psychiatric:        Mood and Affect: Mood normal.        Behavior: Behavior normal.    Lab Results:  Recent Labs    07/06/19 0547  HGB 13.2  HCT 38.5*   BMET Recent Labs    07/06/19 0547  NA 136  K 3.7  CL 103  CO2 22  GLUCOSE 128*  BUN 17  CREATININE 1.05  CALCIUM 8.3*   Assessment & Plan: 54 year old male with Gleason 3+4 prostate cancer now s/p robotic assisted laparoscopic prostatectomy with bilateral pelvic lymph node dissection.  He is recovering well with plans for discharge later today.  Given drain creatinine results, will discharge patient today with drain in place. Will plan to repeat drain creatinine on POD7 with repeat clinic visit on POD10 for possible drain and/or Foley removal.  Debroah Loop, PA-C 07/06/2019

## 2019-07-06 NOTE — Clinical Social Work Note (Signed)
Patient's PCP used to be Dr. Glendon Axe at [facility] here at the hospital but then she moved. Patient agreeable to still going to [facility]. CSW called and said Dr. Ouida Sills, Dr. Ginette Pitman, Dr. Netty Starring, and Dr. Wynetta Emery have May/June appts but patient could likely get an earlier appt if he goes with Dr. Tressia Miners or Dr. Ola Spurr. Patient said he thinks he has seen Dr. Ola Spurr in the past. Per secretary, patient will have to call and let them know which provider he prefers so they can set up the appt. CSW wrote this information down and gave it to the patient and his wife. No further concerns. CSW signing off.  Jeff Rice, [place]

## 2019-07-06 NOTE — Discharge Summary (Signed)
Date of admission: 07/05/2019  Date of discharge: 07/06/2019  Admission diagnosis: Gleason 3+4 prostate cancer  Discharge diagnosis: Same as above  Secondary diagnoses:  Patient Active Problem List   Diagnosis Date Noted  . Prostate cancer ([place]) 07/05/2019  . Elevated PSA 09/14/2016  . 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 Abdominal mass, left lower quadrant 09/13/2016  . Atypical chest pain 09/14/2015  . BP (high blood pressure) 09/14/2015  . Stress fracture 05/13/2015  . Heel pain 05/13/2015  . Benign essential hypertension 11/09/2014  . Erectile dysfunction 11/09/2014  . Pure hypercholesterolemia 11/09/2014    History and Physical: For full details, please see admission history and physical. Briefly, Jeff Rice is a 54 y.o. year old patient admitted on 07/05/2019 for scheduled robotic assisted laparoscopic radical prostatectomy, bilateral partial nerve sparing, and bilateral pelvic lymph node dissection with Dr. Erlene Quan.  Of note, he was found to have a small anastomotic leak intraoperatively requiring placement of a 19 round Blake drain in the dependent pelvis.  Hospital Course: Patient tolerated the procedure well.  He was then transferred to the floor after an uneventful PACU stay.  His hospital course was uncomplicated.  On POD#1 he had met discharge criteria: was eating a regular diet, was up and ambulating independently,  pain was well controlled, catheter was draining well, and was ready for discharge.  He was discharged with drain and Foley catheter in place with plans for outpatient repeat drain creatinine on POD 7 with possible drain and/or Foley removal on POD 10.  Laboratory values:  Recent Labs    07/06/19 0547  HGB 13.2  HCT 38.5*   Recent Labs    07/06/19 0547  NA 136  K 3.7  CL 103  CO2 22  GLUCOSE 128*  BUN 17  CREATININE 1.05  CALCIUM 8.3*   Results for orders placed or performed during the hospital encounter of 07/02/19  SARS CORONAVIRUS 2 (TAT 6-24  HRS) Nasopharyngeal Nasopharyngeal Swab     Status: None   Collection Time: 07/02/19  8:41 AM   Specimen: Nasopharyngeal Swab  Result Value Ref Range Status   SARS Coronavirus 2 NEGATIVE NEGATIVE Final    Comment: (NOTE) SARS-CoV-2 target nucleic acids are NOT DETECTED. The SARS-CoV-2 RNA is generally detectable in upper and lower respiratory specimens during the acute phase of infection. Negative results do not preclude SARS-CoV-2 infection, do not rule out co-infections with other pathogens, and should not be used as the sole basis for treatment or other patient management decisions. Negative results must be combined with clinical observations, patient history, and epidemiological information. The expected result is Negative. Fact Sheet for Patients: SugarRoll.be Fact Sheet for Healthcare Providers: https://www.woods-mathews.com/ This test is not yet approved or cleared by the [place] FDA and  has been authorized for detection and/or diagnosis of SARS-CoV-2 by FDA under an Emergency Use Authorization (EUA). This EUA will remain  in effect (meaning this test can be used) for the duration of the COVID-19 declaration under Section 56 4(b)(1) of the Act, 21 U.S.C. section 360bbb-3(b)(1), unless the authorization is terminated or revoked sooner. Performed at [facility], [place] [address] [place], [place] [address]    Disposition: Home  Discharge instruction: The patient was instructed to be ambulatory but told to refrain from heavy lifting, strenuous activity, or driving.   Discharge medications:  Allergies as of 07/06/2019   No Known Allergies     Medication List    TAKE these medications   allopurinol 300 MG tablet  Commonly known as: ZYLOPRIM Take 300 mg by mouth daily.   aspirin EC 81 MG tablet Take 81 mg by mouth daily.   Co Q-10 200 MG Caps Take 200 mg by mouth daily.   colchicine 0.6 MG tablet Take 0.6 mg by  mouth daily as needed (gout).   docusate sodium 100 MG capsule Commonly known as: COLACE Take 1 capsule (100 mg total) by mouth 2 (two) times daily for 10 days.   lisinopril-hydrochlorothiazide 20-25 MG tablet Commonly known as: ZESTORETIC Take 1 tablet by mouth daily.   Melatonin 5 MG Tabs Take 5 mg by mouth at bedtime.   oxybutynin 5 MG tablet Commonly known as: DITROPAN Take 1 tablet (5 mg total) by mouth every 8 (eight) hours as needed for bladder spasms.   oxyCODONE-acetaminophen 5-325 MG tablet Commonly known as: PERCOCET/ROXICET Take 1-2 tablets by mouth every 6 (six) hours as needed for moderate pain.   Red Yeast Rice 600 MG Tabs Take 1,800 mg by mouth daily.   sildenafil 100 MG tablet Commonly known as: VIAGRA Take 100 mg by mouth daily as needed for erectile dysfunction.       Followup:  POD 7 repeat drain creatinine, POD 10 possible Foley and/or drain removal, 4 week postop follow-up with PSA prior, as below. Future Appointments  Date Time Provider [place]  07/12/2019  9:15 AM Debroah Loop, PA-C [facility] None  07/15/2019 10:00 AM Viktoriya Glaspy, Aldona Bar, PA-C [facility] None  08/02/2019  2:00 PM [facility] None  08/04/2019  1:45 PM Hollice Espy, MD [facility] None

## 2019-07-07 ENCOUNTER — Telehealth: Payer: Self-pay | Admitting: *Deleted

## 2019-07-07 LAB — SURGICAL PATHOLOGY

## 2019-07-07 NOTE — Telephone Encounter (Signed)
Patient states he noticed so blood in his urine . I talked with him and advised him it was normal after surgery . Just make sure there is no clotting going on.

## 2019-07-09 ENCOUNTER — Telehealth: Payer: Self-pay | Admitting: *Deleted

## 2019-07-09 NOTE — Telephone Encounter (Signed)
Patient called Triage line, he has not had a bowel movement since his surgery. He has been taking Colace and staying hydrated. He also developed a blister next to the bandage. Denies redness or rash. He has been pulling off the tape and replacing it. Instructed not to pop, advise to let us know if it worsens.

## 2019-07-09 NOTE — Telephone Encounter (Signed)
Call patient to discuss the above issues.  Advised to take a suppository or MiraLAX to help with a bowel movement.  He is passing gas.  Discussed wound care for what sounds like dermatitis from tape.  Discussed surgical pathology results, extracapsular extension negative margins negative lymph nodes.  Plan for drain creatinine Monday, drain to be removed depending on these results.  He reports about 75 cc of output in the drain per day.  Hollice Espy, MD

## 2019-07-12 ENCOUNTER — Ambulatory Visit (INDEPENDENT_AMBULATORY_CARE_PROVIDER_SITE_OTHER): Payer: BC Managed Care – PPO | Admitting: Physician Assistant

## 2019-07-12 ENCOUNTER — Telehealth: Payer: Self-pay | Admitting: *Deleted

## 2019-07-12 ENCOUNTER — Encounter: Payer: Self-pay | Admitting: Physician Assistant

## 2019-07-12 ENCOUNTER — Other Ambulatory Visit
Admission: RE | Admit: 2019-07-12 | Discharge: 2019-07-12 | Disposition: A | Discharge status: Home / Self Care | Payer: BC Managed Care – PPO | Source: Ambulatory Visit | Attending: Physician Assistant | Admitting: Physician Assistant

## 2019-07-12 ENCOUNTER — Other Ambulatory Visit: Payer: Self-pay

## 2019-07-12 VITALS — BP 148/81 | HR 81 | Ht 68.0 in | Wt 240.0 lb

## 2019-07-12 DIAGNOSIS — C61 Malignant neoplasm of prostate: Secondary | ICD-10-CM | POA: Insufficient documentation

## 2019-07-12 LAB — CREATININE, FLUID (PLEURAL, PERITONEAL, JP DRAINAGE): Creat, Fluid: 1 mg/dL

## 2019-07-12 NOTE — Telephone Encounter (Addendum)
Patient informed, voiced understanding. Preferred to have drain removed tomorrow, appointment scheduled.   ----- Message from Hollice Espy, MD sent at 07/12/2019  2:05 PM EST ----- Can you guys call him and let him know that his JP creatinine is normal.  He can either wait until Thursday at the time of catheter removal to have his drain removed or he can come have it removed tomorrow.  We will leave this up to him.  There is no evidence of ongoing leak.  Hollice Espy, MD

## 2019-07-12 NOTE — Progress Notes (Signed)
Patient return to clinic today to provide a sample from his JP drain for drain creatinine.  Sample obtained in a sterile urine cup without difficulty and delivered to the [facility] lab for send out to the [facility] lab.  Additionally, he provided a daily drain record, output as below.  Date 24 hour output  07/07/2019 259mL  07/08/2019 210mL  07/09/2019 119mL  07/10/2019 163mL  07/11/2019 167mL   Patient to RTC in 3 days for possible drain and/or Foley removal, per drain creatinine results.

## 2019-07-12 NOTE — Addendum Note (Signed)
Addended by: Mickle Plumb on: 07/12/2019 09:45 AM   Modules accepted: Orders

## 2019-07-13 ENCOUNTER — Ambulatory Visit: Payer: BLUE CROSS/BLUE SHIELD | Admitting: Physician Assistant

## 2019-07-13 ENCOUNTER — Encounter: Payer: Self-pay | Admitting: Physician Assistant

## 2019-07-13 ENCOUNTER — Ambulatory Visit (INDEPENDENT_AMBULATORY_CARE_PROVIDER_SITE_OTHER): Payer: BC Managed Care – PPO | Admitting: Physician Assistant

## 2019-07-13 VITALS — BP 126/74 | HR 76 | Ht 70.0 in | Wt 240.0 lb

## 2019-07-13 DIAGNOSIS — Z4803 Encounter for change or removal of drains: Secondary | ICD-10-CM

## 2019-07-13 NOTE — Progress Notes (Signed)
Patient presented to clinic today for JP drain removal.  He is POD8 from robotic assisted laparoscopic radical prostatectomy and bilateral pelvic lymph node dissection with Dr. Erlene Quan.  He was found to have a small anastomotic leak intraoperatively requiring placement of a 19 round Blake drain in the dependent pelvis.  Repeat drain creatinine drawn yesterday was normal, reflecting resolution of the anastomotic leak.  Using sterile forceps and scissors, I snipped the securing stitch and removed the fragments without difficulty.  I subsequently removed the Blake drain from the abdomen; no complications noted.  I covered the drain site with a sterile 4 x 4 gauze and secured in place with a 4 x 4 Tegaderm.  I provided the patient with 2 additional Tegaderms and more sterile gauze to replace the bandage as needed over the next 2 to 3 days.  Of note, patient reports a blistering rash at the site of paper tape placement following surgery.  He had no previously known allergies and states he has never had a reaction like this to Band-Aids or other adhesive products.  I have documented an allergy to paper tape in his chart.

## 2019-07-15 ENCOUNTER — Encounter: Payer: Self-pay | Admitting: Physician Assistant

## 2019-07-15 ENCOUNTER — Other Ambulatory Visit: Payer: Self-pay

## 2019-07-15 ENCOUNTER — Ambulatory Visit (INDEPENDENT_AMBULATORY_CARE_PROVIDER_SITE_OTHER): Payer: BC Managed Care – PPO | Admitting: Physician Assistant

## 2019-07-15 VITALS — BP 125/77 | HR 80 | Ht 68.0 in | Wt 240.0 lb

## 2019-07-15 DIAGNOSIS — C61 Malignant neoplasm of prostate: Secondary | ICD-10-CM

## 2019-07-15 NOTE — Progress Notes (Signed)
Catheter Removal  Patient is present today for a catheter removal.  30ml of water was drained from the balloon. A 18FR foley cath was removed from the bladder no complications were noted . Patient tolerated well.  Performed by: Debroah Loop, PA-C   Follow up/ Additional notes: Scheduled postop follow-up with PSA prior, as below. Future Appointments  Date Time Provider [place]  08/02/2019  2:00 PM [facility] None  08/04/2019  1:45 PM Hollice Espy, MD [facility] None

## 2019-07-30 ENCOUNTER — Other Ambulatory Visit: Payer: Self-pay | Admitting: Family Medicine

## 2019-07-30 ENCOUNTER — Other Ambulatory Visit: Payer: 59

## 2019-07-30 DIAGNOSIS — C61 Malignant neoplasm of prostate: Secondary | ICD-10-CM

## 2019-08-02 ENCOUNTER — Other Ambulatory Visit: Payer: BC Managed Care – PPO

## 2019-08-02 ENCOUNTER — Other Ambulatory Visit: Payer: Self-pay

## 2019-08-02 DIAGNOSIS — C61 Malignant neoplasm of prostate: Secondary | ICD-10-CM

## 2019-08-03 LAB — PSA: Prostate Specific Ag, Serum: <0.1 ng/mL (ref 0.0–4.0)

## 2019-08-03 NOTE — Progress Notes (Signed)
08/04/19 8:25 PM   Jeff Rice [birth year] [id number]  Referring provider: No referring provider defined for this encounter.  Chief Complaint  Patient presents with  . Prostate Cancer    HPI: Jeff Rice is a 54 yo M with a history of prostate cancer who returns today for post-op f/u.    He is essentially a fluctuating PSA which may persistently elevated 5.0.  He ultimately underwent prostate MRI on 9/20 which showed a 55 cc volume prostate of PI-RADS 4 lesion at the right left anterior capsule with suspicion for extracapsular extension at this location.  No evidence of adenopathy or seminal vesicle invasion.  He underwent fusion biopsy on 05/18/2019 which showed 5 5 cores of Gleason 3+4 in the area of interest.  In addition to this, 9 of 12 of the standard cores of Gleason 3+3 and 3+4, right side containing the intermediate risk tumor.  He had a XI robotic assited laparoscopic radical prostatectomy performed on 07/05/2019. His surgical pathology 07/05/19 gleason 3+4 with less than 5% of a tertiary pattern 5+EPE focal - SB -margin -lymph nodes pT2pN0.   His most recent PSA from 08/02/19 was undetectable.   He reports of leakage and has not started physical activities. He reports of 2x nocturia and has been continuing pelvic floor exercises. Leakage significant and bothersome but improving.  He reports of semi erections post op but not adequate for penetration. He has used [facility] in the past.   He was concerned about his hernia but asymptomatic. He wanted to also remove his suprapubic fat pad later on in the future along with hernia if needed.    PMH: Past Medical History:  Diagnosis Date  . BPH (benign prostatic hyperplasia)   . ED (erectile dysfunction)   . HLD (hyperlipidemia)   . Hypertension   . Rising PSA level     Surgical History: Past Surgical History:  Procedure Laterality Date  . HERNIA REPAIR  2002  . ROBOT ASSISTED LAPAROSCOPIC RADICAL PROSTATECTOMY  N/A 07/05/2019   Procedure: XI ROBOTIC ASSISTED LAPAROSCOPIC RADICAL PROSTATECTOMY WITH LYMPH NODE DISSECTION;  Surgeon: Hollice Espy, MD;  Location: [facility] ORS;  Service: Urology;  Laterality: N/A;  . salivary gland excision      Home Medications:  Allergies as of 08/04/2019      Reactions   Tape Dermatitis   Paper tape only, developed contact dermatitis with blistering rash Paper tape only, developed contact dermatitis with blistering rash      Medication List       Accurate as of August 04, 2019  8:25 PM. If you have any questions, ask your nurse or doctor.        allopurinol 300 MG tablet Commonly known as: ZYLOPRIM Take 300 mg by mouth daily.   aspirin EC 81 MG tablet Take 81 mg by mouth daily.   Co Q-10 200 MG Caps Take 200 mg by mouth daily.   colchicine 0.6 MG tablet Take 0.6 mg by mouth daily as needed (gout).   lisinopril-hydrochlorothiazide 20-25 MG tablet Commonly known as: ZESTORETIC Take 1 tablet by mouth daily.   Melatonin 5 MG Tabs Take 5 mg by mouth at bedtime.   oxybutynin 5 MG tablet Commonly known as: DITROPAN Take 1 tablet (5 mg total) by mouth every 8 (eight) hours as needed for bladder spasms.   oxyCODONE-acetaminophen 5-325 MG tablet Commonly known as: PERCOCET/ROXICET Take 1-2 tablets by mouth every 6 (six) hours as needed for moderate pain.   Red Yeast Rice 600 MG  Tabs Take 1,800 mg by mouth daily.   sildenafil 100 MG tablet Commonly known as: VIAGRA Take 100 mg by mouth daily as needed for erectile dysfunction.   sildenafil 20 MG tablet Commonly known as: Revatio Take 1 tablet (20 mg total) by mouth as needed. Take 1-5 tabs as needed prior to intercourse Started by: Hollice Espy, MD       Allergies:  Allergies  Allergen Reactions  . Tape Dermatitis    Paper tape only, developed contact dermatitis with blistering rash Paper tape only, developed contact dermatitis with blistering rash    Family History: Family History    Problem Relation Age of Onset  . Benign prostatic hyperplasia Father   . Heart disease Father   . Diabetes Mellitus II Father   . Colon cancer Father   . Throat cancer Paternal Grandfather   . Prostate cancer Paternal Grandfather   . Heart disease Brother   . Bladder Cancer Neg Hx   . Kidney cancer Neg Hx     Social History:  reports that he has never smoked. He has never used smokeless tobacco. He reports that he does not drink alcohol or use drugs.   Physical Exam: BP (!) 147/81   Pulse 71   Ht 5\' 8"  (1.727 m)   Wt 240 lb (108.9 kg)   BMI 36.49 kg/m   Constitutional:  Alert and oriented, No acute distress. HEENT: [place] AT, moist mucus membranes.  Trachea midline, no masses. Cardiovascular: No clubbing, cyanosis, or edema. Respiratory: Normal respiratory effort, no increased work of breathing. GI: Abdomen is soft, nontender, nondistended, no abdominal masses. Suprapubic fat pad.  Skin: No rashes, bruises or suspicious lesions. Neurologic: Grossly intact, no focal deficits, moving all 4 extremities. Psychiatric: Normal mood and affect.  Laboratory Data: Lab Results  Component Value Date   WBC 5.7 07/02/2019   HGB 13.2 07/06/2019   HCT 38.5 (L) 07/06/2019   MCV 93.9 07/02/2019   PLT 275 07/02/2019    Lab Results  Component Value Date   CREATININE 1.05 07/06/2019     Assessment & Plan:    1. Prostat cancer NED, PSA undetectable, repeat PSA in 6 months Surgical path reviewed   2. Stress incontinence Cleared for full activities  Pt not interested in PT referral; will exercise by himself  Gradual improvement anticipated   3. 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  Hollice Espy,  I have reviewed the above documentation for accuracy and completeness, and I agree with the above.   Hollice Espy, MD

## 2019-08-04 ENCOUNTER — Other Ambulatory Visit: Payer: Self-pay

## 2019-08-04 ENCOUNTER — Ambulatory Visit (INDEPENDENT_AMBULATORY_CARE_PROVIDER_SITE_OTHER): Payer: BC Managed Care – PPO | Admitting: Urology

## 2019-08-04 VITALS — BP 147/81 | HR 71 | Ht 68.0 in | Wt 240.0 lb

## 2019-08-04 DIAGNOSIS — C61 Malignant neoplasm of prostate: Secondary | ICD-10-CM

## 2019-08-04 DIAGNOSIS — N393 Stress incontinence (female) (male): Secondary | ICD-10-CM

## 2019-08-04 DIAGNOSIS — N529 Male erectile dysfunction, unspecified: Secondary | ICD-10-CM

## 2019-08-04 MED ORDER — SILDENAFIL CITRATE 20 MG PO TABS: 20.0000 mg | ORAL_TABLET | ORAL | 11 refills | Status: DC | PRN

## 2019-09-27 ENCOUNTER — Telehealth: Payer: Self-pay | Admitting: Radiology

## 2019-09-27 NOTE — Telephone Encounter (Signed)
Patient would like prior authorization for sildenafil for [place] (phone number [phone]) and script sent there.

## 2019-09-29 MED ORDER — SILDENAFIL CITRATE 20 MG PO TABS: 20.0000 mg | ORAL_TABLET | ORAL | 3 refills | Status: DC | PRN

## 2019-09-29 NOTE — Telephone Encounter (Signed)
Left patient a message to call, the lowest cost for sildenafil is GoodRX coupon with [employer] 90 pills for $18

## 2019-09-29 NOTE — Telephone Encounter (Signed)
Patient notified and would like to go ahead with [facility] [place]. Script was sent and GoodRx coupon was texted to patient.

## 2019-09-29 NOTE — Addendum Note (Signed)
Addended by: Tommy Rainwater on: 09/29/2019 04:29 PM   Modules accepted: Orders

## 2020-01-16 ENCOUNTER — Encounter: Payer: Self-pay | Admitting: Emergency Medicine

## 2020-01-16 ENCOUNTER — Other Ambulatory Visit: Payer: Self-pay

## 2020-01-16 ENCOUNTER — Emergency Department: Payer: BC Managed Care – PPO

## 2020-01-16 ENCOUNTER — Emergency Department
Admission: EM | Admit: 2020-01-16 | Discharge: 2020-01-16 | Disposition: A | Discharge status: Home / Self Care | Payer: BC Managed Care – PPO | Attending: Emergency Medicine | Admitting: Emergency Medicine

## 2020-01-16 DIAGNOSIS — Z7982 Long term (current) use of aspirin: Secondary | ICD-10-CM | POA: Diagnosis not present

## 2020-01-16 DIAGNOSIS — U071 COVID-19: Secondary | ICD-10-CM | POA: Insufficient documentation

## 2020-01-16 DIAGNOSIS — Z20822 Contact with and (suspected) exposure to covid-19: Secondary | ICD-10-CM | POA: Diagnosis not present

## 2020-01-16 DIAGNOSIS — Z79899 Other long term (current) drug therapy: Secondary | ICD-10-CM | POA: Insufficient documentation

## 2020-01-16 DIAGNOSIS — I10 Essential (primary) hypertension: Secondary | ICD-10-CM | POA: Diagnosis not present

## 2020-01-16 DIAGNOSIS — R509 Fever, unspecified: Secondary | ICD-10-CM | POA: Diagnosis present

## 2020-01-16 LAB — URINALYSIS, COMPLETE (UACMP) WITH MICROSCOPIC
Bacteria, UA: NONE SEEN
Bilirubin Urine: NEGATIVE
Glucose, UA: NEGATIVE mg/dL
Hgb urine dipstick: NEGATIVE
Ketones, ur: 5 mg/dL — AB
Leukocytes,Ua: NEGATIVE
Nitrite: NEGATIVE
Protein, ur: 100 mg/dL — AB
Specific Gravity, Urine: 1.023 (ref 1.005–1.030)
Squamous Epithelial / HPF: NONE SEEN (ref 0–5)
pH: 6 (ref 5.0–8.0)

## 2020-01-16 LAB — COMPREHENSIVE METABOLIC PANEL
ALT: 125 U/L — ABNORMAL HIGH (ref 0–44)
AST: 140 U/L — ABNORMAL HIGH (ref 15–41)
Albumin: 4 g/dL (ref 3.5–5.0)
Alkaline Phosphatase: 44 U/L (ref 38–126)
Anion gap: 13 (ref 5–15)
BUN: 15 mg/dL (ref 6–20)
CO2: 26 mmol/L (ref 22–32)
Calcium: 8.3 mg/dL — ABNORMAL LOW (ref 8.9–10.3)
Chloride: 99 mmol/L (ref 98–111)
Creatinine, Ser: 0.83 mg/dL (ref 0.61–1.24)
GFR calc Af Amer: >60 mL/min (ref >60)
GFR calc non Af Amer: >60 mL/min (ref >60)
Glucose, Bld: 122 mg/dL — ABNORMAL HIGH (ref 70–99)
Potassium: 3.3 mmol/L — ABNORMAL LOW (ref 3.5–5.1)
Sodium: 138 mmol/L (ref 135–145)
Total Bilirubin: 1 mg/dL (ref 0.3–1.2)
Total Protein: 7 g/dL (ref 6.5–8.1)

## 2020-01-16 LAB — CBC
HCT: 42.4 % (ref 39.0–52.0)
Hemoglobin: 15.2 g/dL (ref 13.0–17.0)
MCH: 32.6 pg (ref 26.0–34.0)
MCHC: 35.8 g/dL (ref 30.0–36.0)
MCV: 91 fL (ref 80.0–100.0)
Platelets: 143 10*3/uL — ABNORMAL LOW (ref 150–400)
RBC: 4.66 MIL/uL (ref 4.22–5.81)
RDW: 12.4 % (ref 11.5–15.5)
WBC: 3.3 10*3/uL — ABNORMAL LOW (ref 4.0–10.5)
nRBC: 0 % (ref 0.0–0.2)

## 2020-01-16 LAB — SARS CORONAVIRUS 2 BY RT PCR (HOSPITAL ORDER, PERFORMED IN ~~LOC~~ HOSPITAL LAB): SARS Coronavirus 2: POSITIVE — AB

## 2020-01-16 MED ORDER — IBUPROFEN 600 MG PO TABS
600.0000 mg | ORAL_TABLET | Freq: Once | ORAL | Status: AC
  Administered 2020-01-16: 600 mg via ORAL
  Filled 2020-01-16: qty 1

## 2020-01-16 MED ORDER — ONDANSETRON 4 MG PO TBDP
4.0000 mg | ORAL_TABLET | Freq: Three times a day (TID) | ORAL | 0 refills | Status: DC | PRN
Start: 2020-01-16 — End: 2020-02-16

## 2020-01-16 MED ORDER — SODIUM CHLORIDE 0.9 % IV BOLUS
1000.0000 mL | Freq: Once | INTRAVENOUS | Status: AC
  Administered 2020-01-16: 1000 mL via INTRAVENOUS

## 2020-01-16 MED ORDER — LOPERAMIDE HCL 2 MG PO TABS
2.0000 mg | ORAL_TABLET | Freq: Four times a day (QID) | ORAL | 0 refills | Status: DC | PRN
Start: 2020-01-16 — End: 2020-08-16

## 2020-01-16 MED ORDER — ONDANSETRON HCL 4 MG/2ML IJ SOLN
4.0000 mg | Freq: Once | INTRAMUSCULAR | Status: AC
  Administered 2020-01-16: 4 mg via INTRAVENOUS
  Filled 2020-01-16: qty 2

## 2020-01-16 NOTE — ED Triage Notes (Signed)
Patient states that he has been exposed to Covid and that he has had cough and fever times ten days. Patient states that his fever is usually low grade but has been up to 102. Patient states that today he started noticed that his urine stream is not as strong.

## 2020-01-16 NOTE — ED Notes (Signed)
See triage note. Pt reports recent prostate issues; fever and cough for 10 days; skin oily but not diaphoretic. Denies N/V. Reports diarrhea for several days and dec urination for last 2-3 days. States his wife came in with him who is currently being intubated per pt. States they have both been sick for about the same amount of days. States has used mucinex and tylenol without much relief. Resp reg/unlabored.

## 2020-01-16 NOTE — ED Notes (Signed)
Attempted 20g IV at R ac.

## 2020-01-16 NOTE — ED Provider Notes (Signed)
[facility] Emergency Department Provider Note  ____________________________________________  Time seen: Approximately 1:07 PM  I have reviewed the triage vital signs and the nursing notes.   HISTORY  Chief Complaint Cough and Fever    HPI Jeff Rice is a 54 y.o. male who presents the emergency department complaining of generalized body aches, cough, fatigue, diarrhea.  Patient states that he has had symptoms for the past 10 days.  His wife is Covid positive and is having severe respiratory difficulties.  Patient states that she is currently on BiPAP and is being prepared to be intubated.  Patient states that he is used Tylenol and Mucinex which improved some of his respiratory symptoms but he is having diarrhea and decreased urination.  Patient had a history of BPH/prostate cancer but had a prostatectomy and states that he typically has no difficulties with urinating.  Patient has no pain with urination, just states that he is urinating less and with a less forceful stream.  Patient states that he has been drinking as much fluid as possible trying to drink a 16 to 20 ounce bottle of water every 1-2 hours.  Patient has a history of BPH, prostate cancer, gout, hypertension.        Past Medical History:  Diagnosis Date  . BPH (benign prostatic hyperplasia)   . ED (erectile dysfunction)   . HLD (hyperlipidemia)   . Hypertension   . Rising PSA level     Patient Active Problem List   Diagnosis Date Noted  . Prostate cancer ([place]) 07/05/2019  . Elevated PSA 09/14/2016  . 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 Abdominal mass, left lower quadrant 09/13/2016  . Atypical chest pain 09/14/2015  . BP (high blood pressure) 09/14/2015  . Stress fracture 05/13/2015  . Heel pain 05/13/2015  . Benign essential hypertension 11/09/2014  . Erectile dysfunction 11/09/2014  . Pure hypercholesterolemia 11/09/2014    Past Surgical History:  Procedure Laterality Date  .  HERNIA REPAIR  2002  . ROBOT ASSISTED LAPAROSCOPIC RADICAL PROSTATECTOMY N/A 07/05/2019   Procedure: XI ROBOTIC ASSISTED LAPAROSCOPIC RADICAL PROSTATECTOMY WITH LYMPH NODE DISSECTION;  Surgeon: Hollice Espy, MD;  Location: [facility] ORS;  Service: Urology;  Laterality: N/A;  . salivary gland excision      Prior to Admission medications   Medication Sig Start Date End Date Taking? Authorizing Provider  allopurinol (ZYLOPRIM) 300 MG tablet Take 300 mg by mouth daily.  06/12/16 07/01/19  [provider]  aspirin EC 81 MG tablet Take 81 mg by mouth daily.    [provider]  Coenzyme Q10 (CO Q-10) 200 MG CAPS Take 200 mg by mouth daily.    [provider]  colchicine 0.6 MG tablet Take 0.6 mg by mouth daily as needed (gout).    [provider]  lisinopril-hydrochlorothiazide (ZESTORETIC) 20-25 MG tablet Take 1 tablet by mouth daily. 05/21/19   [provider]  loperamide (IMODIUM A-D) 2 MG tablet Take 1 tablet (2 mg total) by mouth 4 (four) times daily as needed for diarrhea or loose stools. 01/16/20   [place] Rena, Charline Bills, PA-C  Melatonin 5 MG TABS Take 5 mg by mouth at bedtime.    [provider]  ondansetron (ZOFRAN-ODT) 4 MG disintegrating tablet Take 1 tablet (4 mg total) by mouth every 8 (eight) hours as needed for nausea or vomiting. 01/16/20   Wilda Wetherell, Charline Bills, PA-C  oxybutynin (DITROPAN) 5 MG tablet Take 1 tablet (5 mg total) by mouth every 8 (eight) hours as needed  for bladder spasms. 07/06/19   Debroah Loop, PA-C  oxyCODONE-acetaminophen (PERCOCET/ROXICET) 5-325 MG tablet Take 1-2 tablets by mouth every 6 (six) hours as needed for moderate pain. 07/06/19   Vaillancourt, Aldona Bar, PA-C  Red Yeast Rice 600 MG TABS Take 1,800 mg by mouth daily.     [provider]  sildenafil (REVATIO) 20 MG tablet Take 1 tablet (20 mg total) by mouth as needed. Take 1-5 tabs as needed prior to intercourse 09/29/19   Hollice Espy, MD   sildenafil (VIAGRA) 100 MG tablet Take 100 mg by mouth daily as needed for erectile dysfunction.  02/15/15   [provider]    Allergies Tape  Family History  Problem Relation Age of Onset  . Benign prostatic hyperplasia Father   . Heart disease Father   . Diabetes Mellitus II Father   . Colon cancer Father   . Throat cancer Paternal Grandfather   . Prostate cancer Paternal Grandfather   . Heart disease Brother   . Bladder Cancer Neg Hx   . Kidney cancer Neg Hx     Social History Social History   Tobacco Use  . Smoking status: Never Smoker  . Smokeless tobacco: Never Used  Vaping Use  . Vaping Use: Never used  Substance Use Topics  . Alcohol use: No    Alcohol/week: 0.0 standard drinks  . Drug use: No     Review of Systems  Constitutional: Positive fever/chills.  Positive for malaise and body aches Eyes: No visual changes. No discharge ENT: No upper respiratory complaints. Cardiovascular: no chest pain. Respiratory: Resolving cough. No SOB. Gastrointestinal: No abdominal pain.  No nausea, no vomiting.  Positive diarrhea.  No constipation. Genitourinary: Negative for dysuria. No hematuria.  Decreased urination. Musculoskeletal: Negative for musculoskeletal pain. Skin: Negative for rash, abrasions, lacerations, ecchymosis. Neurological: Negative for headaches, focal weakness or numbness. 10-point ROS otherwise negative.  ____________________________________________   PHYSICAL EXAM:  VITAL SIGNS: ED Triage Vitals  Enc Vitals Group     BP 01/16/20 0253 140/75     Pulse Rate 01/16/20 0253 86     Resp 01/16/20 0253 18     Temp 01/16/20 0253 99.1 F (37.3 C)     Temp Source 01/16/20 0253 Oral     SpO2 01/16/20 0253 94 %     Weight 01/16/20 0255 244 lb (110.7 kg)     Height 01/16/20 0255 5\' 8"  (1.727 m)     Head Circumference --      Peak Flow --      Pain Score 01/16/20 0259 0     Pain Loc --      Pain Edu? --      Excl. in [place]? --       Constitutional: Alert and oriented. Well appearing and in no acute distress. Eyes: Conjunctivae are normal. PERRL. EOMI. Head: Atraumatic. ENT:      Ears: EACs and TMs unremarkable bilaterally.      Nose: No congestion/rhinnorhea.      Mouth/Throat: Mucous membranes are slightly dry.  No erythema or edema. Neck: No stridor.  No cervical spine tenderness to palpation. Cardiovascular: Normal rate, regular rhythm. Normal S1 and S2.  Good peripheral circulation. Respiratory: Normal respiratory effort without tachypnea or retractions. Lungs CTAB. Good air entry to the bases with no decreased or absent breath sounds. Gastrointestinal: Bowel sounds 4 quadrants. Soft and nontender to palpation. No guarding or rigidity. No palpable masses. No distention. No CVA tenderness. Musculoskeletal: Full range of motion to all  extremities. No gross deformities appreciated. Neurologic:  Normal speech and language. No gross focal neurologic deficits are appreciated.  Skin:  Skin is warm, dry and intact. No rash noted. Psychiatric: Mood and affect are normal. Speech and behavior are normal. Patient exhibits appropriate insight and judgement.   ____________________________________________   LABS (all labs ordered are listed, but only abnormal results are displayed)  Labs Reviewed  SARS CORONAVIRUS 2 BY RT PCR ([facility]) - Abnormal; Notable for the following components:      Result Value   SARS Coronavirus 2 POSITIVE (*)    All other components within normal limits  CBC - Abnormal; Notable for the following components:   WBC 3.3 (*)    Platelets 143 (*)    All other components within normal limits  COMPREHENSIVE METABOLIC PANEL - Abnormal; Notable for the following components:   Potassium 3.3 (*)    Glucose, Bld 122 (*)    Calcium 8.3 (*)    AST 140 (*)    ALT 125 (*)    All other components within normal limits  URINALYSIS, COMPLETE (UACMP) WITH  MICROSCOPIC - Abnormal; Notable for the following components:   Color, Urine YELLOW (*)    APPearance CLEAR (*)    Ketones, ur 5 (*)    Protein, ur 100 (*)    All other components within normal limits   ____________________________________________  EKG   ____________________________________________  RADIOLOGY I personally viewed and evaluated these images as part of my medical decision making, as well as reviewing the written report by the radiologist.  DG Chest 2 View  Result Date: 01/16/2020 CLINICAL DATA:  Cough and fever.  COVID exposure. EXAM: CHEST - 2 VIEW COMPARISON:  Radiograph 09/18/2015, chest CT 09/04/2008 FINDINGS: Patchy heterogeneous bilateral lung opacities in a mid lower lung zone predominant distribution. Heart is normal in size. Unchanged mediastinal contours. Opacity abutting the right heart border corresponds to a prominent epicardial fat pad on prior chest CT. No pleural fluid or pneumothorax. No acute osseous abnormalities are seen. IMPRESSION: Patchy heterogeneous bilateral lung opacities in a mid lower lung zone predominant distribution, suspicious for multifocal pneumonia, pattern typical of COVID-19 pneumonia. Electronically Signed   By: Keith Rake M.D.   On: 01/16/2020 03:45    ____________________________________________    PROCEDURES  Procedure(s) performed:    Procedures    Medications  ibuprofen (ADVIL) tablet 600 mg (600 mg Oral Given 01/16/20 1159)  sodium chloride 0.9 % bolus 1,000 mL (1,000 mLs Intravenous New Bag/Given 01/16/20 1337)  ondansetron (ZOFRAN) injection 4 mg (4 mg Intravenous Given 01/16/20 1335)     ____________________________________________   INITIAL IMPRESSION / ASSESSMENT AND PLAN / ED COURSE  Pertinent labs & imaging results that were available during my care of the patient were reviewed by me and considered in my medical decision making (see chart for details).  Review of the [place] CSRS was performed in  accordance of the [place] prior to dispensing any controlled drugs.           Patient's diagnosis is consistent with COVID-19.  Patient presented to the emergency department complaining of multiple Covid symptoms with close contact with his wife who is Covid positive.  Was having diarrhea, decreased urination resents emergency department for evaluation.  Patient did have respiratory symptoms at the beginning of his illness but states that these have largely resolved.  Patient will be given IV fluids, symptom control medications at home.  Follow-up primary care as  needed.  Patient is given ED precautions to return to the ED for any worsening or new symptoms.     ____________________________________________  FINAL CLINICAL IMPRESSION(S) / ED DIAGNOSES  Final diagnoses:  COVID-19      NEW MEDICATIONS STARTED DURING THIS VISIT:  ED Discharge Orders         Ordered    ondansetron (ZOFRAN-ODT) 4 MG disintegrating tablet  Every 8 hours PRN     Discontinue  Reprint     01/16/20 1406    loperamide (IMODIUM A-D) 2 MG tablet  4 times daily PRN     Discontinue  Reprint     01/16/20 1406              This chart was dictated using voice recognition software/Dragon. Despite best efforts to proofread, errors can occur which can change the meaning. Any change was purely unintentional.    Darletta Moll, PA-C 01/16/20 [place], MD 01/16/20 1511

## 2020-02-03 ENCOUNTER — Other Ambulatory Visit: Payer: Self-pay

## 2020-02-04 ENCOUNTER — Other Ambulatory Visit: Payer: BC Managed Care – PPO

## 2020-02-04 ENCOUNTER — Other Ambulatory Visit: Payer: Self-pay

## 2020-02-04 DIAGNOSIS — C61 Malignant neoplasm of prostate: Secondary | ICD-10-CM

## 2020-02-05 LAB — PSA: Prostate Specific Ag, Serum: <0.1 ng/mL (ref 0.0–4.0)

## 2020-02-10 ENCOUNTER — Ambulatory Visit: Payer: Self-pay | Admitting: Urology

## 2020-02-15 NOTE — Progress Notes (Signed)
02/16/2020 3:58 PM   Jeff Rice 03/03/1966 [id number]  Referring provider: No referring provider defined for this encounter. Chief Complaint  Patient presents with   Prostate Cancer    HPI: Jeff Rice is a 54 y.o. male who presents today for a 6 month follow up of prostate cancer, stress incontinence and ED after radical prostatectomy.   He is essentially a fluctuating PSA which may persistently elevated 5.0. He ultimately underwent prostate MRI on 9/20 which showed a 55 cc volume prostate of PI-RADS 4 lesion at the right left anterior capsule with suspicion for extracapsular extension at this location. No evidence of adenopathy or seminal vesicle invasion.  He underwent fusion biopsy on 05/18/2019 which showed 5 5 cores of Gleason 3+4 in the area of interest. In addition to this, 9 of 12 of the standard cores of Gleason 3+3 and 3+4, right side containing the intermediate risk tumor.  He had a XI robotic assited laparoscopic radical prostatectomy performed on 07/05/2019. His surgical pathology 07/05/19 gleason 3+4 with less than 5% of a tertiary pattern 5+EPE focal - SB -margin -lymph nodes pT2pN0.   PSA remains undetectable.   In terms of erections, he has had some partial erections with he had improvement with low dose sildenafil prior to recent passing of his wife.   He was doing well with his continence but had a setback during his COVID-19 infection when he was coughing vigorously.  He is not been good about doing pelvic floor therapy.  Unfortunately, his wife passed tragically from COVID-51 this month.   PMH: Past Medical History:  Diagnosis Date   BPH (benign prostatic hyperplasia)    ED (erectile dysfunction)    HLD (hyperlipidemia)    Hypertension    Rising PSA level     Surgical History: Past Surgical History:  Procedure Laterality Date   HERNIA REPAIR  2002   ROBOT ASSISTED LAPAROSCOPIC RADICAL PROSTATECTOMY N/A 07/05/2019   Procedure:  XI ROBOTIC ASSISTED LAPAROSCOPIC RADICAL PROSTATECTOMY WITH LYMPH NODE DISSECTION;  Surgeon: Hollice Espy, MD;  Location: [facility] ORS;  Service: Urology;  Laterality: N/A;   salivary gland excision      Home Medications:  Allergies as of 02/16/2020      Reactions   Tape Dermatitis   Paper tape only, developed contact dermatitis with blistering rash Paper tape only, developed contact dermatitis with blistering rash      Medication List       Accurate as of February 16, 2020  3:58 PM. If you have any questions, ask your nurse or doctor.        STOP taking these medications   ondansetron 4 MG disintegrating tablet Commonly known as: ZOFRAN-ODT Stopped by: Hollice Espy, MD   oxybutynin 5 MG tablet Commonly known as: DITROPAN Stopped by: Hollice Espy, MD   oxyCODONE-acetaminophen 5-325 MG tablet Commonly known as: PERCOCET/ROXICET Stopped by: Hollice Espy, MD   sildenafil 100 MG tablet Commonly known as: VIAGRA Stopped by: Hollice Espy, MD   sildenafil 20 MG tablet Commonly known as: Revatio Stopped by: Hollice Espy, MD     TAKE these medications   allopurinol 300 MG tablet Commonly known as: ZYLOPRIM Take 300 mg by mouth daily.   ALPRAZolam 0.5 MG tablet Commonly known as: XANAX Take 0.5 mg by mouth 2 (two) times daily as needed.   aspirin EC 81 MG tablet Take 81 mg by mouth daily.   Co Q-10 200 MG Caps Take 200 mg by mouth daily.   colchicine 0.6 MG  tablet Take 0.6 mg by mouth daily as needed (gout).   lisinopril-hydrochlorothiazide 20-25 MG tablet Commonly known as: ZESTORETIC Take 1 tablet by mouth daily.   loperamide 2 MG tablet Commonly known as: IMODIUM A-D Take 1 tablet (2 mg total) by mouth 4 (four) times daily as needed for diarrhea or loose stools.   melatonin 5 MG Tabs Take 5 mg by mouth at bedtime.   Red Yeast Rice 600 MG Tabs Take 1,800 mg by mouth daily.       Allergies:  Allergies  Allergen Reactions   Tape  Dermatitis    Paper tape only, developed contact dermatitis with blistering rash Paper tape only, developed contact dermatitis with blistering rash    Family History: Family History  Problem Relation Age of Onset   Benign prostatic hyperplasia Father    Heart disease Father    Diabetes Mellitus II Father    Colon cancer Father    Throat cancer Paternal Grandfather    Prostate cancer Paternal Grandfather    Heart disease Brother    Bladder Cancer Neg Hx    Kidney cancer Neg Hx     Social History:  reports that he has never smoked. He has never used smokeless tobacco. He reports that he does not drink alcohol and does not use drugs.   Physical Exam: BP (!) 155/83    Pulse 80   Constitutional:  Alert and oriented, No acute distress. HEENT: [place] AT, moist mucus membranes.  Trachea midline, no masses. Cardiovascular: No clubbing, cyanosis, or edema. Respiratory: Normal respiratory effort, no increased work of breathing. Skin: No rashes, bruises or suspicious lesions. Neurologic: Grossly intact, no focal deficits, moving all 4 extremities. Psychiatric: Normal mood and affect.  Laboratory Data:  Lab Results  Component Value Date   CREATININE 0.83 01/16/2020    Assessment & Plan:   1. Prostate cancer NED, PSA remains undetectable.  Repeat PSA in 6 months.  Surgical path reviewed   2. Stress incontinence Urinary symptoms are improving s/p covid Encouraged patient to resume pelvic floor exercises  Anticipate gradual improvement  3. ED after radical prostatectomy  Continue penile rehab  He had partial improvement with low dose sildenafil   PSA in 6 months (lab only), PSA MD visit in 1 year  [facility] [address] [place], [place] [address] [phone]  I, Selena Batten, am acting as a scribe for Dr. Hollice Espy.  I have reviewed the above documentation for accuracy and completeness, and I agree with the above.     Hollice Espy, MD

## 2020-02-16 ENCOUNTER — Other Ambulatory Visit: Payer: Self-pay

## 2020-02-16 ENCOUNTER — Ambulatory Visit: Payer: BC Managed Care – PPO | Admitting: Urology

## 2020-02-16 ENCOUNTER — Encounter: Payer: Self-pay | Admitting: Urology

## 2020-02-16 VITALS — BP 155/83 | HR 80

## 2020-02-16 DIAGNOSIS — C61 Malignant neoplasm of prostate: Secondary | ICD-10-CM | POA: Diagnosis not present

## 2020-02-16 NOTE — Patient Instructions (Addendum)
[place] surgery-   [phone]

## 2020-06-27 ENCOUNTER — Other Ambulatory Visit: Payer: Self-pay

## 2020-06-27 ENCOUNTER — Telehealth (INDEPENDENT_AMBULATORY_CARE_PROVIDER_SITE_OTHER): Payer: Managed Care, Other (non HMO) | Admitting: Urology

## 2020-06-27 ENCOUNTER — Encounter: Payer: Self-pay | Admitting: *Deleted

## 2020-06-27 DIAGNOSIS — N393 Stress incontinence (female) (male): Secondary | ICD-10-CM | POA: Diagnosis not present

## 2020-06-27 DIAGNOSIS — C61 Malignant neoplasm of prostate: Secondary | ICD-10-CM | POA: Diagnosis not present

## 2020-06-27 DIAGNOSIS — N5231 Erectile dysfunction following radical prostatectomy: Secondary | ICD-10-CM

## 2020-06-27 MED ORDER — NONFORMULARY OR COMPOUNDED ITEM: 0 refills | Status: DC

## 2020-06-27 NOTE — Patient Instructions (Addendum)
[facility] in [place], [place] Address: [address], [place], [place] [address] Hours: Closes [place]: Mask required  Staff required to disinfect surfaces between visits  Safety dividers at checkout  More details Phone: [phone]

## 2020-06-27 NOTE — Progress Notes (Unsigned)
This service is provided via telemedicine   No vital signs collected/recorded due to the encounter was a telemedicine visit.     Patient consents to a telephone visit: Yes, all medications, history and pharmacy updated and verified.     Names of all persons participating in the telemedicine service and their role in the encounter: Verlene Mayer, [place]

## 2020-06-29 ENCOUNTER — Encounter: Payer: Self-pay | Admitting: Urology

## 2020-06-29 NOTE — Progress Notes (Signed)
Virtual Visit via Video Note  I connected with Jeff Rice on 06/29/20 at  1:15 PM EST by a video enabled telemedicine application and verified that I am speaking with the correct person using two identifiers.  Location: Patient: home Provider: office   I discussed the limitations of evaluation and management by telemedicine and the availability of in person appointments. The patient expressed understanding and agreed to proceed.  History of Present Illness: 55 year old male with a personal history of prostate cancer status post prostatectomy who returns today specifically to discuss ongoing erectile dysfunction.  He reports that over the past year or so, he has regained his continence for the most part.  He wears an occasional safety pad.  He leaks more when he is physically active.  Overall, is not particular bothered by this.  He had a XI robotic assited laparoscopic radical prostatectomy performed on 07/05/2019.His surgical pathology 07/05/19 gleason 3+4 with less than 5% of a tertiary pattern 5+EPE focal - SB -margin -lymph nodes pT2pN0.  PSA undetectable as of 02/04/2020.  He will be due for PSA in a month and a half from now.  He is most concerned today about his ongoing erectile dysfunction.  He had moderate erectile dysfunction prior to surgery, baseline Shim 16.  He was using Viagra preoperatively.  Postoperatively, this is essentially unchanged.  He notes now that even with the 100 mg of sildenafil, he achieves some fullness into the penis but not sufficient for penetration.  He is able to orgasm masturbate.  He like to explore additional treatment options.   Observations/Objective: Pleasant, interactive  Assessment and Plan:  1. Erectile dysfunction after radical prostatectomy Lengthy discussion today about management options.  Essentially this point, given his preoperative erectile dysfunction and ongoing inability to achieve satisfactory erections with PDE 5 inhibitors,  we discussed alternative treatments including intracavernosal, urethral suppository, as well as vacuum erection and penile prosthesis as alternatives.  We discussed risk and benefits of each.  We discussed our office teaching a dose titration as a relates to intracavernosal injections.  We discussed sending a test dose to compounding pharmacy which he should pick up a day or 2 before the appointment.  He will return for teaching in our office along with dose titration in the office total in our PA spirits once we find his appropriate dose, will call in the medication to the pharmacy.  We also discussed alternative options including Caverject which he may want to pursue if he has good success with intracavernosal injection.  He like to hold off on penile prosthesis for the time being until he explores intracavernosal injection.  He had a lot of questions all of which were answered.  2. Stress incontinence of urine Improving, strongly recommend continued pelvic floor exercises and weight loss which will likely help  Overall satisfied  3. Prostate cancer [facility]) Due for PSA in 3 months, will have him get a lab only for this we will call him with the result as results Recommend PSA testing on q 6 month basis   Follow Up Instructions: Arrange for injection teaching, PSA only in March, will call with results  Follow-up for PSA only in 6 months thereafter followed by annual visit with PSA with me   I discussed the assessment and treatment plan with the patient. The patient was provided an opportunity to ask questions and all were answered. The patient agreed with the plan and demonstrated an understanding of the instructions.   The patient was advised to  call back or seek an in-person evaluation if the symptoms worsen or if the condition fails to improve as anticipated.  I provided 19 minutes of non-face-to-face time during this encounter.  I spent 30 total minutes on the day of the encounter  including pre-visit review of the medical record, face-to-face time with the patient, and post visit ordering of labs/imaging/tests.    Hollice Espy, MD

## 2020-07-10 ENCOUNTER — Other Ambulatory Visit: Payer: Self-pay

## 2020-07-10 ENCOUNTER — Encounter: Payer: Self-pay | Admitting: Surgery

## 2020-07-10 ENCOUNTER — Ambulatory Visit (INDEPENDENT_AMBULATORY_CARE_PROVIDER_SITE_OTHER): Payer: Managed Care, Other (non HMO) | Admitting: Surgery

## 2020-07-10 VITALS — BP 151/83 | HR 80 | Temp 98.1°F | Ht 68.0 in | Wt 223.4 lb

## 2020-07-10 DIAGNOSIS — K4031 Unilateral inguinal hernia, with obstruction, without gangrene, recurrent: Secondary | ICD-10-CM | POA: Diagnosis not present

## 2020-07-10 DIAGNOSIS — K432 Incisional hernia without obstruction or gangrene: Secondary | ICD-10-CM

## 2020-07-10 DIAGNOSIS — K4091 Unilateral inguinal hernia, without obstruction or gangrene, recurrent: Secondary | ICD-10-CM

## 2020-07-10 MED ORDER — NONFORMULARY OR COMPOUNDED ITEM: 0 refills | Status: DC

## 2020-07-10 NOTE — Progress Notes (Signed)
Patient ID: Jeff Rice, male   DOB: 1965/10/14, 55 y.o.   MRN: [id number]  HPI Jeff Rice is a 55 y.o. male seen in consultation at the request of Dr. Erlene Quan for a symptomatic left inguinal hernia.  He reports that he has had it for several years after he is prostatectomy seem to have increase in size.  He reports some discomfort on the left inguinal area with some pulling and some spasms left side.  Reports this is a spasms rather than the pain.  There is no specific alleviating factors.  This seems to be worsening when he exercises. He had a robotic prostatectomy last year and did very well. He also had a prior open repair of left inguinal hernia more than 20 years ago on outside facility.  I do not have access to the operative report, he does not remember whether or not they used mesh. He did have a CT scan of the abdomen and pelvis 3 years ago that I have personally reviewed showing evidence of a large left inguinal hernia I do think that there is also a component of the right side. He is able to perform more than 4 METS of activity without any shortness of breath or chest pain.   HPI  Past Medical History:  Diagnosis Date  . BPH (benign prostatic hyperplasia)   . ED (erectile dysfunction)   . HLD (hyperlipidemia)   . Hypertension   . Rising PSA level     Past Surgical History:  Procedure Laterality Date  . HERNIA REPAIR  2002  . ROBOT ASSISTED LAPAROSCOPIC RADICAL PROSTATECTOMY N/A 07/05/2019   Procedure: XI ROBOTIC ASSISTED LAPAROSCOPIC RADICAL PROSTATECTOMY WITH LYMPH NODE DISSECTION;  Surgeon: Hollice Espy, MD;  Location: [facility] ORS;  Service: Urology;  Laterality: N/A;  . salivary gland excision      Family History  Problem Relation Age of Onset  . Benign prostatic hyperplasia Father   . Heart disease Father   . Diabetes Mellitus II Father   . Colon cancer Father   . Throat cancer Paternal Grandfather   . Prostate cancer Paternal Grandfather   . Heart disease  Brother   . Bladder Cancer Neg Hx   . Kidney cancer Neg Hx     Social History Social History   Tobacco Use  . Smoking status: Never Smoker  . Smokeless tobacco: Never Used  Vaping Use  . Vaping Use: Never used  Substance Use Topics  . Alcohol use: No    Alcohol/week: 0.0 standard drinks  . Drug use: No    Allergies  Allergen Reactions  . Tape Dermatitis    Paper tape only, developed contact dermatitis with blistering rash Paper tape only, developed contact dermatitis with blistering rash    Current Outpatient Medications  Medication Sig Dispense Refill  . ALPRAZolam (XANAX) 0.5 MG tablet Take 0.5 mg by mouth 2 (two) times daily as needed.    Marland Kitchen aspirin EC 81 MG tablet Take 81 mg by mouth daily.    Marland Kitchen BLACK CURRANT SEED OIL PO Take by mouth.    . Cholecalciferol (VITAMIN D3) 100000 UNIT/GM POWD by Does not apply route.    . Coenzyme Q10 (CO Q-10) 200 MG CAPS Take 200 mg by mouth daily.    . colchicine 0.6 MG tablet Take 0.6 mg by mouth daily as needed (gout).    Marland Kitchen lisinopril-hydrochlorothiazide (ZESTORETIC) 20-25 MG tablet Take 1 tablet by mouth daily.    . Melatonin 5 MG TABS Take 5 mg by  mouth at bedtime.    . NONFORMULARY OR COMPOUNDED ITEM Trimix (30/1/10)-(Pap/Phent/PGE)  Test Dose  1ml vial   Qty #3 Refills 0  [facility] [phone] Fax [phone] 3 each 0  . Red Yeast Rice 600 MG TABS Take 1,800 mg by mouth daily.     . loperamide (IMODIUM A-D) 2 MG tablet Take 1 tablet (2 mg total) by mouth 4 (four) times daily as needed for diarrhea or loose stools. 30 tablet 0   No current facility-administered medications for this visit.     Review of Systems Full ROS  was asked and was negative except for the information on the HPI  Physical Exam Blood pressure (!) 151/83, pulse 80, temperature 98.1 F (36.7 C), temperature source Oral, height 5' 8" (1.727 m), weight 223 lb 6.4 oz (101.3 kg), SpO2 97 %. CONSTITUTIONAL: NAD EYES: Pupils are equal, round,  a, Sclera are non-icteric. EARS, NOSE, MOUTH AND THROAT: He is wearing a mask. Hearing is intact to voice. LYMPH NODES:  Lymph nodes in the neck are normal. RESPIRATORY:  Lungs are clear. There is normal respiratory effort, with equal breath sounds bilaterally, and without pathologic use of accessory muscles. CARDIOVASCULAR: Heart is regular without murmurs, gallops, or rubs. GI: The abdomen is soft, there is evidence of a chronically incarcerated left inguinal hernia is mildly tender to palpation.  Also evidence of prior incisional hernia in the supraumbilical area.   GU: Rectal deferred.   MUSCULOSKELETAL: Normal muscle strength and tone. No cyanosis or edema.   SKIN: Turgor is good and there are no pathologic skin lesions or ulcers. NEUROLOGIC: Motor and sensation is grossly normal. Cranial nerves are grossly intact. PSYCH:  Oriented to person, place and time. Affect is normal.  Data Reviewed  I have personally reviewed the patient's imaging, laboratory findings and medical records.    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  Discussed with patient in detail about cc prognosis.  I do recommend repair of hernias.  I do think that we can address at least 2 hernias during the same operative settings.  I do think that he would be a good candidate for robotic approach.  He understands that there is a chance that we will have to perform bilateral inguinal hernia repair if we need fact finding bilateral defects.  Procedure discussed with the patient in detail.  Risks, benefits and possible applications including but not limited to: Bleeding, infection, injury to adjacent structures, chronic pain, mesh issues, bowel injuries.  He understands and wishes to proceed. A copy of this report was sent to the referring provider     Marijane Trower, MD FACS General Surgeon 07/10/2020, 12:06 PM   

## 2020-07-10 NOTE — Patient Instructions (Addendum)
Our surgery scheduler will call you within 24-48 hours to schedule your surgery. Please have the [facility] surgery sheet available when speaking with her.    Inguinal Hernia, Adult An inguinal hernia is when fat or your intestines push through a weak spot in a muscle where your leg meets your lower belly (groin). This causes a bulge. This kind of hernia could also be:  In your scrotum, if you are male.  In folds of skin around your vagina, if you are male. There are three types of inguinal hernias:  Hernias that can be pushed back into the belly (are reducible). This type rarely causes pain.  Hernias that cannot be pushed back into the belly (are incarcerated).  Hernias that cannot be pushed back into the belly and lose their blood supply (are strangulated). This type needs emergency surgery. What are the causes? This condition is caused by having a weak spot in the muscles or tissues in your groin. This develops over time. The hernia may poke through the weak spot when you strain your lower belly muscles all of a sudden, such as when you:  Lift a heavy object.  Strain to poop (have a bowel movement). Trouble pooping (constipation) can lead to straining.  Cough. What increases the risk? This condition is more likely to develop in:  Males.  Pregnant females.  People who: ? Are overweight. ? Work in jobs that require long periods of standing or heavy lifting. ? Have had an inguinal hernia before. ? Smoke or have lung disease. These factors can lead to long-term (chronic) coughing. What are the signs or symptoms? Symptoms may depend on the size of the hernia. Often, a small hernia has no symptoms. Symptoms of a larger hernia may include:  A bulge in the groin area. This is easier to see when standing. You might not be able to see it when you are lying down.  Pain or burning in the groin. This may get worse when you lift, strain, or cough.  A dull ache or a feeling of pressure in  the groin.  An abnormal bulge in the scrotum, in males. Symptoms of a strangulated inguinal hernia may include:  A bulge in your groin that is very painful and tender to the touch.  A bulge that turns red or purple.  Fever, feeling like you may vomit (nausea), and vomiting.  Not being able to poop or to pass gas. How is this treated? Treatment depends on the size of your hernia and whether you have symptoms. If you do not have symptoms, your doctor may have you watch your hernia carefully and have you come in for follow-up visits. If your hernia is large or if you have symptoms, you may need surgery to repair the hernia. Follow these instructions at home: Lifestyle  Avoid lifting heavy objects.  Avoid standing for long amounts of time.  Do not smoke or use any products that contain nicotine or tobacco. If you need help quitting, ask your doctor.  Stay at a healthy weight. Prevent trouble pooping You may need to take these actions to prevent or treat trouble pooping:  Drink enough fluid to keep your pee (urine) pale yellow.  Take over-the-counter or prescription medicines.  Eat foods that are high in fiber. These include beans, whole grains, and fresh fruits and vegetables.  Limit foods that are high in fat and sugar. These include fried or sweet foods. General instructions  You may try to push your hernia back in  place by very gently pressing on it when you are lying down. Do not try to push the bulge back in if it will not go in easily.  Watch your hernia for any changes in shape, size, or color. Tell your doctor if you see any changes.  Take over-the-counter and prescription medicines only as told by your doctor.  Keep all follow-up visits. Contact a doctor if:  You have a fever or chills.  You have new symptoms.  Your symptoms get worse. Get help right away if:  You have pain in your groin that gets worse all of a sudden.  You have a bulge in your groin  that: ? Gets bigger all of a sudden, and it does not get smaller after that. ? Turns red or purple. ? Is painful when you touch it.  You are a male, and you have: ? Sudden pain in your scrotum. ? A sudden change in the size of your scrotum.  You cannot push the hernia back in place by very gently pressing on it when you are lying down.  You feel like you may vomit, and that feeling does not go away.  You keep vomiting.  You have a fast heartbeat.  You cannot poop or pass gas. These symptoms may be an emergency. Get help right away. Call your local emergency services (911 in the [place]  Do not wait to see if the symptoms will go away.  Do not drive yourself to the hospital. Summary  An inguinal hernia is when fat or your intestines push through a weak spot in a muscle where your leg meets your lower belly (groin). This causes a bulge.  If you do not have symptoms, you may not need treatment. If you have symptoms or a large hernia, you may need surgery.  Avoid lifting heavy objects. Also, avoid standing for long amounts of time.  Do not try to push the bulge back in if it will not go in easily. This information is not intended to replace advice given to you by your health care provider. Make sure you discuss any questions you have with your health care provider. Document Revised: 01/18/2020 Document Reviewed: 01/18/2020 Elsevier Patient Education  2021 [employer].

## 2020-07-10 NOTE — H&P (View-Only) (Signed)
Patient ID: Jeff Rice, male   DOB: 1965/10/14, 55 y.o.   MRN: [id number]  HPI Jeff Rice is a 55 y.o. male seen in consultation at the request of Dr. Erlene Quan for a symptomatic left inguinal hernia.  He reports that he has had it for several years after he is prostatectomy seem to have increase in size.  He reports some discomfort on the left inguinal area with some pulling and some spasms left side.  Reports this is a spasms rather than the pain.  There is no specific alleviating factors.  This seems to be worsening when he exercises. He had a robotic prostatectomy last year and did very well. He also had a prior open repair of left inguinal hernia more than 20 years ago on outside facility.  I do not have access to the operative report, he does not remember whether or not they used mesh. He did have a CT scan of the abdomen and pelvis 3 years ago that I have personally reviewed showing evidence of a large left inguinal hernia I do think that there is also a component of the right side. He is able to perform more than 4 METS of activity without any shortness of breath or chest pain.   HPI  Past Medical History:  Diagnosis Date  . BPH (benign prostatic hyperplasia)   . ED (erectile dysfunction)   . HLD (hyperlipidemia)   . Hypertension   . Rising PSA level     Past Surgical History:  Procedure Laterality Date  . HERNIA REPAIR  2002  . ROBOT ASSISTED LAPAROSCOPIC RADICAL PROSTATECTOMY N/A 07/05/2019   Procedure: XI ROBOTIC ASSISTED LAPAROSCOPIC RADICAL PROSTATECTOMY WITH LYMPH NODE DISSECTION;  Surgeon: Hollice Espy, MD;  Location: [facility] ORS;  Service: Urology;  Laterality: N/A;  . salivary gland excision      Family History  Problem Relation Age of Onset  . Benign prostatic hyperplasia Father   . Heart disease Father   . Diabetes Mellitus II Father   . Colon cancer Father   . Throat cancer Paternal Grandfather   . Prostate cancer Paternal Grandfather   . Heart disease  Brother   . Bladder Cancer Neg Hx   . Kidney cancer Neg Hx     Social History Social History   Tobacco Use  . Smoking status: Never Smoker  . Smokeless tobacco: Never Used  Vaping Use  . Vaping Use: Never used  Substance Use Topics  . Alcohol use: No    Alcohol/week: 0.0 standard drinks  . Drug use: No    Allergies  Allergen Reactions  . Tape Dermatitis    Paper tape only, developed contact dermatitis with blistering rash Paper tape only, developed contact dermatitis with blistering rash    Current Outpatient Medications  Medication Sig Dispense Refill  . ALPRAZolam (XANAX) 0.5 MG tablet Take 0.5 mg by mouth 2 (two) times daily as needed.    Marland Kitchen aspirin EC 81 MG tablet Take 81 mg by mouth daily.    Marland Kitchen BLACK CURRANT SEED OIL PO Take by mouth.    . Cholecalciferol (VITAMIN D3) 100000 UNIT/GM POWD by Does not apply route.    . Coenzyme Q10 (CO Q-10) 200 MG CAPS Take 200 mg by mouth daily.    . colchicine 0.6 MG tablet Take 0.6 mg by mouth daily as needed (gout).    Marland Kitchen lisinopril-hydrochlorothiazide (ZESTORETIC) 20-25 MG tablet Take 1 tablet by mouth daily.    . Melatonin 5 MG TABS Take 5 mg by  mouth at bedtime.    . NONFORMULARY OR COMPOUNDED ITEM Trimix (30/1/10)-(Pap/Phent/PGE)  Test Dose  29ml vial   Qty #3 Refills 0  [facility] [phone] Fax [phone] 3 each 0  . Red Yeast Rice 600 MG TABS Take 1,800 mg by mouth daily.     Marland Kitchen loperamide (IMODIUM A-D) 2 MG tablet Take 1 tablet (2 mg total) by mouth 4 (four) times daily as needed for diarrhea or loose stools. 30 tablet 0   No current facility-administered medications for this visit.     Review of Systems Full ROS  was asked and was negative except for the information on the HPI  Physical Exam Blood pressure (!) 151/83, pulse 80, temperature 98.1 F (36.7 C), temperature source Oral, height 5\' 8"  (1.727 m), weight 223 lb 6.4 oz (101.3 kg), SpO2 97 %. CONSTITUTIONAL: NAD EYES: Pupils are equal, round,  a, Sclera are non-icteric. EARS, NOSE, MOUTH AND THROAT: He is wearing a mask. Hearing is intact to voice. LYMPH NODES:  Lymph nodes in the neck are normal. RESPIRATORY:  Lungs are clear. There is normal respiratory effort, with equal breath sounds bilaterally, and without pathologic use of accessory muscles. CARDIOVASCULAR: Heart is regular without murmurs, gallops, or rubs. GI: The abdomen is soft, there is evidence of a chronically incarcerated left inguinal hernia is mildly tender to palpation.  Also evidence of prior incisional hernia in the supraumbilical area.   GU: Rectal deferred.   MUSCULOSKELETAL: Normal muscle strength and tone. No cyanosis or edema.   SKIN: Turgor is good and there are no pathologic skin lesions or ulcers. NEUROLOGIC: Motor and sensation is grossly normal. Cranial nerves are grossly intact. PSYCH:  Oriented to person, place and time. Affect is normal.  Data Reviewed  I have personally reviewed the patient's imaging, laboratory findings and medical records.    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  Discussed with patient in detail about cc prognosis.  I do recommend repair of hernias.  I do think that we can address at least 2 hernias during the same operative settings.  I do think that he would be a good candidate for robotic approach.  He understands that there is a chance that we will have to perform bilateral inguinal hernia repair if we need fact finding bilateral defects.  Procedure discussed with the patient in detail.  Risks, benefits and possible applications including but not limited to: Bleeding, infection, injury to adjacent structures, chronic pain, mesh issues, bowel injuries.  He understands and wishes to proceed. A copy of this report was sent to the referring provider     Caroleen Hamman, MD FACS General Surgeon 07/10/2020, 12:06 PM

## 2020-07-11 ENCOUNTER — Telehealth: Payer: Self-pay | Admitting: Surgery

## 2020-07-11 NOTE — Telephone Encounter (Signed)
Outgoing call is made, left message for patient to call.  Please advise patient of Pre-Admission date/time, COVID Testing date and Surgery date.  Surgery Date: 08/03/20 Preadmission Testing Date: 07/27/20 (phone 8a-1p) Covid Testing Date: 08/01/20 - patient advised to go to the [facility] ([place]) between 8a-1p  Also patient to call [phone], between 1-3:00pm the day before surgery, to find out what time to arrive for surgery.

## 2020-07-11 NOTE — Telephone Encounter (Signed)
Incoming call from patient, he is now aware of all dates regarding his surgery and voices understanding.

## 2020-07-17 NOTE — Progress Notes (Signed)
Mr. Barb presents today for a Trimix titration.  He is no longer spontaneous erections. He has had moderate response to PDE5i's.  He denies any history of sickle cell anemia or trait, a history of multiple myeloma or a history of leukemia.  He has not taken trazodone or a PDE5i's today.    Patient's left corpus cavernosum is identified.  An area near the base of the penis is cleansed with rubbing alcohol.  Careful to avoid the dorsal vein, 2 mcg of Trimix (papaverine 30 mg, phentolamine 1 mg and prostaglandin E1 10 mcg, Lot # [id number]$  exp # 08/14/2020) is injected at a 90 degree angle into the left corpus cavernosum near the base of the penis.  Patient experienced a  firm erection in 15 minutes, but it detumesced quickly.  Patient's right corpus cavernosum is identified.  An area near the base of the penis is cleansed with rubbing alcohol.  Careful to avoid the dorsal vein, 2 mcg of Trimix (papaverine 30 mg, phentolamine 1 mg and prostaglandin E1 10 mcg, Lot # [id number]'@17'[id number]$  exp # 08/14/2020) is injected at a 90 degree angle into the left corpus cavernosum near the base of the penis.  Patient experienced a firm erection in 15 minutes, but it detumesced quickly.  He will inject 4 mcg of the Trimix Thursday a.m. and call 08/27/2020 with the response.    PSA is also drawn today.  Advised patient of the condition of priapism, painful erection lasting for more than four hours, and to contact the office immediately or seek treatment in the ED

## 2020-07-18 ENCOUNTER — Ambulatory Visit (INDEPENDENT_AMBULATORY_CARE_PROVIDER_SITE_OTHER): Payer: Managed Care, Other (non HMO) | Admitting: Urology

## 2020-07-18 ENCOUNTER — Encounter: Payer: Self-pay | Admitting: Urology

## 2020-07-18 ENCOUNTER — Other Ambulatory Visit: Payer: Self-pay

## 2020-07-18 VITALS — BP 140/80 | HR 87 | Ht 68.0 in | Wt 217.0 lb

## 2020-07-18 DIAGNOSIS — N5231 Erectile dysfunction following radical prostatectomy: Secondary | ICD-10-CM

## 2020-07-18 DIAGNOSIS — C61 Malignant neoplasm of prostate: Secondary | ICD-10-CM

## 2020-07-18 NOTE — Patient Instructions (Signed)

## 2020-07-19 LAB — PSA: Prostate Specific Ag, Serum: <0.1 ng/mL (ref 0.0–4.0)

## 2020-07-25 ENCOUNTER — Other Ambulatory Visit: Payer: Self-pay | Admitting: Family Medicine

## 2020-07-25 ENCOUNTER — Telehealth: Payer: Self-pay | Admitting: Urology

## 2020-07-25 MED ORDER — NONFORMULARY OR COMPOUNDED ITEM: 6 refills | Status: DC

## 2020-07-25 NOTE — Telephone Encounter (Signed)
Would you send in a script for Trimix (30/1/10) for Mr. Jeff Rice to [facility]?  He will be injecting 5 mcg.

## 2020-07-27 ENCOUNTER — Other Ambulatory Visit
Admission: RE | Admit: 2020-07-27 | Discharge: 2020-07-27 | Disposition: A | Discharge status: Home / Self Care | Payer: Managed Care, Other (non HMO) | Source: Ambulatory Visit | Attending: Surgery | Admitting: Surgery

## 2020-07-27 ENCOUNTER — Other Ambulatory Visit: Payer: Self-pay

## 2020-07-27 HISTORY — DX: Unilateral inguinal hernia, without obstruction or gangrene, not specified as recurrent: K40.90

## 2020-07-27 HISTORY — DX: Personal history of urinary calculi: Z87.442

## 2020-07-27 NOTE — Patient Instructions (Addendum)
Your procedure is scheduled on:  Thursday, March 3 Report to the Registration Desk on the 1st floor of the [facility]. To find out your arrival time, please call [phone] between 1PM - 3PM on: Wednesday, March 2  REMEMBER: Instructions that are not followed completely may result in serious medical risk, up to and including death; or upon the discretion of your surgeon and anesthesiologist your surgery may need to be rescheduled.  Do not eat food after midnight the night before surgery.  No gum chewing, lozengers or hard candies.  You may however, drink CLEAR liquids up to 2 hours before you are scheduled to arrive for your surgery. Do not drink anything within 2 hours of your scheduled arrival time.  Clear liquids include: - water  - apple juice without pulp - gatorade (not RED, PURPLE, OR BLUE) - black coffee or tea (Do NOT add milk or creamers to the coffee or tea) Do NOT drink anything that is not on this list.  DO NOT TAKE ANY MEDICATIONS THE MORNING OF SURGERY   One week prior to surgery: STARTING February 24 Stop ASPIRIN and Anti-inflammatories (NSAIDS) such as Advil, Aleve, Ibuprofen, Motrin, Naproxen, Naprosyn and Aspirin based products such as Excedrin, Goodys Powder, BC Powder. Stop ANY OVER THE COUNTER supplements until after surgery.   No Alcohol for 24 hours before or after surgery.  No Smoking including e-cigarettes for 24 hours prior to surgery.  No chewable tobacco products for at least 6 hours prior to surgery.  No nicotine patches on the day of surgery.  Do not use any "recreational" drugs for at least a week prior to your surgery.  Please be advised that the combination of cocaine and anesthesia may have negative outcomes, up to and including death. If you test positive for cocaine, your surgery will be cancelled.  On the morning of surgery brush your teeth with toothpaste and water, you may rinse your mouth with mouthwash if you wish. Do not swallow any  toothpaste or mouthwash.  Do not wear jewelry.  Do not wear lotions, powders, or perfumes.   Do not shave body from the neck down 48 hours prior to surgery just in case you cut yourself which could leave a site for infection.  Also, freshly shaved skin may become irritated if using the CHG soap.  Contact lenses, hearing aids and dentures may not be worn into surgery.  Do not bring valuables to the hospital. [facility] is not responsible for any missing/lost belongings or valuables.   Use CHG Soap as directed on instruction sheet.  Notify your doctor if there is any change in your medical condition (cold, fever, infection).  Wear comfortable clothing (specific to your surgery type) to the hospital.  Plan for stool softeners for home use; pain medications have a tendency to cause constipation. You can also help prevent constipation by eating foods high in fiber such as fruits and vegetables and drinking plenty of fluids as your diet allows.  After surgery, you can help prevent lung complications by doing breathing exercises.  Take deep breaths and cough every 1-2 hours. Your doctor may order a device called an Incentive Spirometer to help you take deep breaths. When coughing or sneezing, hold a pillow firmly against your incision with both hands. This is called "splinting." Doing this helps protect your incision. It also decreases belly discomfort.  If you are being discharged the day of surgery, you will not be allowed to drive home. You will need  a responsible adult (18 years or older) to drive you home and stay with you that night.   If you are taking public transportation, you will need to have a responsible adult (18 years or older) with you. Please confirm with your physician that it is acceptable to use public transportation.   Please call the [facility] at [phone] if you have any questions about these instructions.  Visitation Policy:  Patients  undergoing a surgery or procedure may have one family member or support person with them as long as that person is not COVID-19 positive or experiencing its symptoms.  That person may remain in the waiting area during the procedure.

## 2020-07-28 ENCOUNTER — Encounter
Admission: RE | Admit: 2020-07-28 | Discharge: 2020-07-28 | Disposition: A | Discharge status: Home / Self Care | Payer: Managed Care, Other (non HMO) | Source: Ambulatory Visit | Attending: Surgery | Admitting: Surgery

## 2020-07-28 DIAGNOSIS — Z01818 Encounter for other preprocedural examination: Secondary | ICD-10-CM | POA: Insufficient documentation

## 2020-07-28 DIAGNOSIS — I10 Essential (primary) hypertension: Secondary | ICD-10-CM | POA: Diagnosis not present

## 2020-07-28 LAB — CBC
HCT: 43.4 % (ref 39.0–52.0)
Hemoglobin: 14.8 g/dL (ref 13.0–17.0)
MCH: 32 pg (ref 26.0–34.0)
MCHC: 34.1 g/dL (ref 30.0–36.0)
MCV: 93.9 fL (ref 80.0–100.0)
Platelets: 262 10*3/uL (ref 150–400)
RBC: 4.62 MIL/uL (ref 4.22–5.81)
RDW: 13.5 % (ref 11.5–15.5)
WBC: 5.1 10*3/uL (ref 4.0–10.5)
nRBC: 0 % (ref 0.0–0.2)

## 2020-07-28 LAB — BASIC METABOLIC PANEL
Anion gap: 8 (ref 5–15)
BUN: 32 mg/dL — ABNORMAL HIGH (ref 6–20)
CO2: 29 mmol/L (ref 22–32)
Calcium: 9.3 mg/dL (ref 8.9–10.3)
Chloride: 102 mmol/L (ref 98–111)
Creatinine, Ser: 1.16 mg/dL (ref 0.61–1.24)
GFR, Estimated: >60 mL/min (ref >60)
Glucose, Bld: 107 mg/dL — ABNORMAL HIGH (ref 70–99)
Potassium: 3.9 mmol/L (ref 3.5–5.1)
Sodium: 139 mmol/L (ref 135–145)

## 2020-08-01 ENCOUNTER — Other Ambulatory Visit: Payer: Self-pay

## 2020-08-01 ENCOUNTER — Other Ambulatory Visit
Admission: RE | Admit: 2020-08-01 | Discharge: 2020-08-01 | Disposition: A | Discharge status: Home / Self Care | Payer: Managed Care, Other (non HMO) | Source: Ambulatory Visit | Attending: Surgery | Admitting: Surgery

## 2020-08-01 DIAGNOSIS — Z01812 Encounter for preprocedural laboratory examination: Secondary | ICD-10-CM | POA: Insufficient documentation

## 2020-08-01 DIAGNOSIS — Z20822 Contact with and (suspected) exposure to covid-19: Secondary | ICD-10-CM | POA: Insufficient documentation

## 2020-08-01 LAB — SARS CORONAVIRUS 2 (TAT 6-24 HRS): SARS Coronavirus 2: NEGATIVE

## 2020-08-03 ENCOUNTER — Ambulatory Visit
Admission: RE | Admit: 2020-08-03 | Discharge: 2020-08-03 | Disposition: A | Discharge status: Home / Self Care | Payer: Managed Care, Other (non HMO) | Attending: Surgery | Admitting: Surgery

## 2020-08-03 ENCOUNTER — Ambulatory Visit: Payer: Managed Care, Other (non HMO) | Admitting: Anesthesiology

## 2020-08-03 ENCOUNTER — Encounter
Admission: RE | Disposition: A | Discharge status: Home / Self Care | Payer: Self-pay | Source: Home / Self Care | Attending: Surgery

## 2020-08-03 ENCOUNTER — Encounter: Payer: Self-pay | Admitting: Surgery

## 2020-08-03 DIAGNOSIS — K4031 Unilateral inguinal hernia, with obstruction, without gangrene, recurrent: Secondary | ICD-10-CM | POA: Diagnosis not present

## 2020-08-03 DIAGNOSIS — K4091 Unilateral inguinal hernia, without obstruction or gangrene, recurrent: Secondary | ICD-10-CM

## 2020-08-03 DIAGNOSIS — Z5331 Laparoscopic surgical procedure converted to open procedure: Secondary | ICD-10-CM | POA: Diagnosis not present

## 2020-08-03 DIAGNOSIS — D176 Benign lipomatous neoplasm of spermatic cord: Secondary | ICD-10-CM | POA: Insufficient documentation

## 2020-08-03 DIAGNOSIS — K432 Incisional hernia without obstruction or gangrene: Secondary | ICD-10-CM

## 2020-08-03 DIAGNOSIS — Z79899 Other long term (current) drug therapy: Secondary | ICD-10-CM | POA: Diagnosis not present

## 2020-08-03 DIAGNOSIS — K4030 Unilateral inguinal hernia, with obstruction, without gangrene, not specified as recurrent: Secondary | ICD-10-CM | POA: Diagnosis present

## 2020-08-03 DIAGNOSIS — Z7982 Long term (current) use of aspirin: Secondary | ICD-10-CM | POA: Insufficient documentation

## 2020-08-03 HISTORY — PX: XI ROBOTIC ASSISTED INGUINAL HERNIA REPAIR WITH MESH: SHX6706

## 2020-08-03 SURGERY — REPAIR, HERNIA, INGUINAL, ROBOT-ASSISTED, LAPAROSCOPIC, USING MESH
Anesthesia: General | Laterality: Left

## 2020-08-03 MED ORDER — DEXAMETHASONE SODIUM PHOSPHATE 10 MG/ML IJ SOLN
INTRAMUSCULAR | Status: DC | PRN
  Administered 2020-08-03: 10 mg via INTRAVENOUS

## 2020-08-03 MED ORDER — FAMOTIDINE 20 MG PO TABS
ORAL_TABLET | ORAL | Status: AC
  Administered 2020-08-03: 20 mg via ORAL
  Filled 2020-08-03: qty 1

## 2020-08-03 MED ORDER — BUPIVACAINE-EPINEPHRINE 0.25% -1:200000 IJ SOLN
INTRAMUSCULAR | Status: DC | PRN
  Administered 2020-08-03: 30 mL

## 2020-08-03 MED ORDER — FENTANYL CITRATE (PF) 100 MCG/2ML IJ SOLN
INTRAMUSCULAR | Status: AC
  Filled 2020-08-03: qty 2

## 2020-08-03 MED ORDER — ROCURONIUM BROMIDE 10 MG/ML (PF) SYRINGE
PREFILLED_SYRINGE | INTRAVENOUS | Status: AC
  Filled 2020-08-03: qty 10

## 2020-08-03 MED ORDER — ONDANSETRON HCL 4 MG/2ML IJ SOLN
INTRAMUSCULAR | Status: DC | PRN
  Administered 2020-08-03: 4 mg via INTRAVENOUS

## 2020-08-03 MED ORDER — ONDANSETRON HCL 4 MG/2ML IJ SOLN: 4.0000 mg | Freq: Once | INTRAMUSCULAR | Status: DC | PRN

## 2020-08-03 MED ORDER — CHLORHEXIDINE GLUCONATE 0.12 % MT SOLN
OROMUCOSAL | Status: AC
  Administered 2020-08-03: 15 mL via OROMUCOSAL
  Filled 2020-08-03: qty 15

## 2020-08-03 MED ORDER — CHLORHEXIDINE GLUCONATE CLOTH 2 % EX PADS: 6.0000 | MEDICATED_PAD | Freq: Once | CUTANEOUS | Status: DC

## 2020-08-03 MED ORDER — LIDOCAINE HCL (PF) 2 % IJ SOLN
INTRAMUSCULAR | Status: AC
  Filled 2020-08-03: qty 5

## 2020-08-03 MED ORDER — GABAPENTIN 300 MG PO CAPS: 300.0000 mg | ORAL_CAPSULE | ORAL | Status: AC

## 2020-08-03 MED ORDER — EPHEDRINE SULFATE 50 MG/ML IJ SOLN
INTRAMUSCULAR | Status: DC | PRN
  Administered 2020-08-03: 10 mg via INTRAVENOUS
  Administered 2020-08-03: 5 mg via INTRAVENOUS
  Administered 2020-08-03: 10 mg via INTRAVENOUS
  Administered 2020-08-03: 5 mg via INTRAVENOUS

## 2020-08-03 MED ORDER — MIDAZOLAM HCL 2 MG/2ML IJ SOLN
INTRAMUSCULAR | Status: DC | PRN
  Administered 2020-08-03: 2 mg via INTRAVENOUS

## 2020-08-03 MED ORDER — CHLORHEXIDINE GLUCONATE 0.12 % MT SOLN: 15.0000 mL | Freq: Once | OROMUCOSAL | Status: AC

## 2020-08-03 MED ORDER — ACETAMINOPHEN 500 MG PO TABS
ORAL_TABLET | ORAL | Status: AC
  Administered 2020-08-03: 1000 mg via ORAL
  Filled 2020-08-03: qty 2

## 2020-08-03 MED ORDER — LACTATED RINGERS IV SOLN: INTRAVENOUS | Status: DC

## 2020-08-03 MED ORDER — ORAL CARE MOUTH RINSE: 15.0000 mL | Freq: Once | OROMUCOSAL | Status: AC

## 2020-08-03 MED ORDER — BUPIVACAINE LIPOSOME 1.3 % IJ SUSP
INTRAMUSCULAR | Status: DC | PRN
  Administered 2020-08-03: 20 mL

## 2020-08-03 MED ORDER — EPHEDRINE 5 MG/ML INJ
INTRAVENOUS | Status: AC
  Filled 2020-08-03: qty 10

## 2020-08-03 MED ORDER — HYDROCODONE-ACETAMINOPHEN 5-325 MG PO TABS: 1.0000 | ORAL_TABLET | ORAL | 0 refills | Status: DC | PRN

## 2020-08-03 MED ORDER — FAMOTIDINE 20 MG PO TABS: 20.0000 mg | ORAL_TABLET | Freq: Once | ORAL | Status: AC

## 2020-08-03 MED ORDER — GABAPENTIN 300 MG PO CAPS
ORAL_CAPSULE | ORAL | Status: AC
  Administered 2020-08-03: 300 mg via ORAL
  Filled 2020-08-03: qty 1

## 2020-08-03 MED ORDER — ONDANSETRON HCL 4 MG/2ML IJ SOLN
INTRAMUSCULAR | Status: AC
  Filled 2020-08-03: qty 2

## 2020-08-03 MED ORDER — CELECOXIB 200 MG PO CAPS
ORAL_CAPSULE | ORAL | Status: AC
  Administered 2020-08-03: 200 mg via ORAL
  Filled 2020-08-03: qty 1

## 2020-08-03 MED ORDER — FENTANYL CITRATE (PF) 100 MCG/2ML IJ SOLN
INTRAMUSCULAR | Status: DC | PRN
  Administered 2020-08-03 (×4): 50 ug via INTRAVENOUS

## 2020-08-03 MED ORDER — ROCURONIUM BROMIDE 100 MG/10ML IV SOLN
INTRAVENOUS | Status: DC | PRN
  Administered 2020-08-03: 30 mg via INTRAVENOUS
  Administered 2020-08-03: 50 mg via INTRAVENOUS
  Administered 2020-08-03: 10 mg via INTRAVENOUS
  Administered 2020-08-03: 50 mg via INTRAVENOUS

## 2020-08-03 MED ORDER — DEXAMETHASONE SODIUM PHOSPHATE 10 MG/ML IJ SOLN
INTRAMUSCULAR | Status: AC
  Filled 2020-08-03: qty 1

## 2020-08-03 MED ORDER — ACETAMINOPHEN 500 MG PO TABS: 1000.0000 mg | ORAL_TABLET | ORAL | Status: AC

## 2020-08-03 MED ORDER — LIDOCAINE HCL (CARDIAC) PF 100 MG/5ML IV SOSY
PREFILLED_SYRINGE | INTRAVENOUS | Status: DC | PRN
  Administered 2020-08-03: 100 mg via INTRAVENOUS

## 2020-08-03 MED ORDER — CEFAZOLIN SODIUM-DEXTROSE 2-4 GM/100ML-% IV SOLN
2.0000 g | INTRAVENOUS | Status: AC
  Administered 2020-08-03: 2 g via INTRAVENOUS

## 2020-08-03 MED ORDER — PROPOFOL 10 MG/ML IV BOLUS
INTRAVENOUS | Status: DC | PRN
  Administered 2020-08-03: 150 mg via INTRAVENOUS

## 2020-08-03 MED ORDER — HYDROCODONE-ACETAMINOPHEN 5-325 MG PO TABS
1.0000 | ORAL_TABLET | Freq: Once | ORAL | Status: AC
  Administered 2020-08-03: 1 via ORAL

## 2020-08-03 MED ORDER — CEFAZOLIN SODIUM-DEXTROSE 2-4 GM/100ML-% IV SOLN
INTRAVENOUS | Status: AC
  Filled 2020-08-03: qty 100

## 2020-08-03 MED ORDER — FENTANYL CITRATE (PF) 100 MCG/2ML IJ SOLN
25.0000 ug | INTRAMUSCULAR | Status: DC | PRN
Start: 2020-08-03 — End: 2020-08-03

## 2020-08-03 MED ORDER — HYDROCODONE-ACETAMINOPHEN 5-325 MG PO TABS
ORAL_TABLET | ORAL | Status: AC
  Filled 2020-08-03: qty 1

## 2020-08-03 MED ORDER — SUGAMMADEX SODIUM 200 MG/2ML IV SOLN
INTRAVENOUS | Status: DC | PRN
  Administered 2020-08-03: 200 mg via INTRAVENOUS

## 2020-08-03 MED ORDER — BUPIVACAINE-EPINEPHRINE (PF) 0.25% -1:200000 IJ SOLN
INTRAMUSCULAR | Status: AC
  Filled 2020-08-03: qty 30

## 2020-08-03 MED ORDER — MIDAZOLAM HCL 2 MG/2ML IJ SOLN
INTRAMUSCULAR | Status: AC
  Filled 2020-08-03: qty 2

## 2020-08-03 MED ORDER — CELECOXIB 200 MG PO CAPS: 200.0000 mg | ORAL_CAPSULE | ORAL | Status: AC

## 2020-08-03 MED ORDER — PROPOFOL 500 MG/50ML IV EMUL
INTRAVENOUS | Status: AC
  Filled 2020-08-03: qty 50

## 2020-08-03 SURGICAL SUPPLY — 57 items
"PENCIL ELECTRO HAND CTR " (MISCELLANEOUS) ×1 IMPLANT
ADH SKN CLS APL DERMABOND .7 (GAUZE/BANDAGES/DRESSINGS) ×1
APL PRP STRL LF DISP 70% ISPRP (MISCELLANEOUS) ×1
CANISTER SUCT 1200ML W/VALVE (MISCELLANEOUS) ×1 IMPLANT
CANNULA REDUC XI 12-8 STAPL (CANNULA) ×1
CANNULA REDUCER 12-8 DVNC XI (CANNULA) ×1 IMPLANT
CHLORAPREP W/TINT 26 (MISCELLANEOUS) ×2 IMPLANT
COVER TIP SHEARS 8 DVNC (MISCELLANEOUS) ×1 IMPLANT
COVER TIP SHEARS 8MM DA VINCI (MISCELLANEOUS) ×1
COVER WAND RF STERILE (DRAPES) ×2 IMPLANT
DEFOGGER SCOPE WARMER CLEARIFY (MISCELLANEOUS) ×2 IMPLANT
DERMABOND ADVANCED (GAUZE/BANDAGES/DRESSINGS) ×1
DERMABOND ADVANCED .7 DNX12 (GAUZE/BANDAGES/DRESSINGS) ×1 IMPLANT
DRAPE 3/4 80X56 (DRAPES) ×1 IMPLANT
DRAPE ARM DVNC X/XI (DISPOSABLE) ×3 IMPLANT
DRAPE COLUMN DVNC XI (DISPOSABLE) ×1 IMPLANT
DRAPE DA VINCI XI ARM (DISPOSABLE) ×3
DRAPE DA VINCI XI COLUMN (DISPOSABLE) ×1
ELECT CAUTERY BLADE 6.4 (BLADE) ×2 IMPLANT
ELECT REM PT RETURN 9FT ADLT (ELECTROSURGICAL) ×2
ELECTRODE REM PT RTRN 9FT ADLT (ELECTROSURGICAL) ×1 IMPLANT
GLOVE SURG ENC MOIS LTX SZ7 (GLOVE) ×8 IMPLANT
GOWN STRL REUS W/ TWL LRG LVL3 (GOWN DISPOSABLE) ×4 IMPLANT
GOWN STRL REUS W/TWL LRG LVL3 (GOWN DISPOSABLE) ×8
IRRIGATION STRYKERFLOW (MISCELLANEOUS) ×1 IMPLANT
IRRIGATOR STRYKERFLOW (MISCELLANEOUS)
IV NS 1000ML (IV SOLUTION)
IV NS 1000ML BAXH (IV SOLUTION) IMPLANT
KIT PINK PAD W/HEAD ARE REST (MISCELLANEOUS) ×2
KIT PINK PAD W/HEAD ARM REST (MISCELLANEOUS) ×1 IMPLANT
LABEL OR SOLS (LABEL) ×2 IMPLANT
MANIFOLD NEPTUNE II (INSTRUMENTS) ×2 IMPLANT
MESH 3DMAX 4X6 LT LRG (Mesh General) ×1 IMPLANT
MESH VENTRALEX ST 2.5 CRC MED (Mesh General) ×1 IMPLANT
NEEDLE HYPO 22GX1.5 SAFETY (NEEDLE) ×2 IMPLANT
OBTURATOR OPTICAL STANDARD 8MM (TROCAR) ×1
OBTURATOR OPTICAL STND 8 DVNC (TROCAR) ×1
OBTURATOR OPTICALSTD 8 DVNC (TROCAR) ×1 IMPLANT
PACK LAP CHOLECYSTECTOMY (MISCELLANEOUS) ×2 IMPLANT
PENCIL ELECTRO HAND CTR (MISCELLANEOUS) ×2 IMPLANT
SEAL CANN UNIV 5-8 DVNC XI (MISCELLANEOUS) ×3 IMPLANT
SEAL XI 5MM-8MM UNIVERSAL (MISCELLANEOUS) ×3
SET TUBE SMOKE EVAC HIGH FLOW (TUBING) ×2 IMPLANT
SOLUTION ELECTROLUBE (MISCELLANEOUS) ×2 IMPLANT
SPONGE KITTNER 5P (MISCELLANEOUS) ×1 IMPLANT
SPONGE LAP 18X18 RF (DISPOSABLE) ×2 IMPLANT
STAPLER CANNULA SEAL DVNC XI (STAPLE) ×1 IMPLANT
STAPLER CANNULA SEAL XI (STAPLE) ×1
SUT ETHIBOND 0 MO6 C/R (SUTURE) ×5 IMPLANT
SUT MNCRL AB 4-0 PS2 18 (SUTURE) ×2 IMPLANT
SUT V-LOC 90 ABS 3-0 VLT  V-20 (SUTURE) ×1
SUT V-LOC 90 ABS 3-0 VLT V-20 (SUTURE) ×2 IMPLANT
SUT VIC AB 2-0 SH 27 (SUTURE) ×8
SUT VIC AB 2-0 SH 27XBRD (SUTURE) ×2 IMPLANT
SUT VICRYL 0 AB UR-6 (SUTURE) ×3 IMPLANT
TAPE TRANSPORE STRL 2 31045 (GAUZE/BANDAGES/DRESSINGS) ×2 IMPLANT
TROCAR BALLN GELPORT 12X130M (ENDOMECHANICALS) ×2 IMPLANT

## 2020-08-03 NOTE — Interval H&P Note (Signed)
History and Physical Interval Note:  08/03/2020 7:22 AM  Jeff Rice  has presented today for surgery, with the diagnosis of left inguinal hernia.  The various methods of treatment have been discussed with the patient and family. After consideration of risks, benefits and other options for treatment, the patient has consented to  Procedure(s): XI ROBOTIC [place], possible bilateral (Left) as a surgical intervention.  The patient's history has been reviewed, patient examined, no change in status, stable for surgery.  I have reviewed the patient's chart and labs.  Questions were answered to the patient's satisfaction.     [place]

## 2020-08-03 NOTE — Anesthesia Preprocedure Evaluation (Signed)
Anesthesia Evaluation  Patient identified by MRN, date of birth, ID band Patient awake    Reviewed: Allergy & Precautions, H&P , NPO status , Patient's Chart, lab work & pertinent test results, reviewed documented beta blocker date and time   History of Anesthesia Complications (+) PONV and history of anesthetic complications  Airway Mallampati: III  TM Distance: <3 FB Neck ROM: full    Dental  (+) Teeth Intact, Dental Advidsory Given, Caps   Pulmonary neg pulmonary ROS,    Pulmonary exam normal        Cardiovascular Exercise Tolerance: Good hypertension, (-) angina(-) Past MI and (-) Cardiac Stents Normal cardiovascular exam(-) dysrhythmias (-) Valvular Problems/Murmurs     Neuro/Psych negative neurological ROS  negative psych ROS   GI/Hepatic negative GI ROS, Neg liver ROS,   Endo/Other  negative endocrine ROS  Renal/GU negative Renal ROS  negative genitourinary   Musculoskeletal  (+) Arthritis , Osteoarthritis,    Abdominal   Peds negative pediatric ROS (+)  Hematology negative hematology ROS (+)   Anesthesia Other Findings Past Medical History: No date: BPH (benign prostatic hyperplasia) No date: ED (erectile dysfunction) No date: HLD (hyperlipidemia) No date: Hypertension No date: Rising PSA level   Reproductive/Obstetrics negative OB ROS                             Anesthesia Physical  Anesthesia Plan  ASA: II  Anesthesia Plan: General   Post-op Pain Management:    Induction: Intravenous  PONV Risk Score and Plan: 3 and Ondansetron, Dexamethasone, Midazolam and Treatment may vary due to age or medical condition  Airway Management Planned: Oral ETT  Additional Equipment:   Intra-op Plan:   Post-operative Plan: Extubation in OR  Informed Consent: I have reviewed the patients History and Physical, chart, labs and discussed the procedure including the risks,  benefits and alternatives for the proposed anesthesia with the patient or authorized representative who has indicated his/her understanding and acceptance.     Dental Advisory Given  Plan Discussed with: Anesthesiologist, CRNA and Surgeon  Anesthesia Plan Comments:         Anesthesia Quick Evaluation

## 2020-08-03 NOTE — Anesthesia Procedure Notes (Signed)
Procedure Name: Intubation Date/Time: 08/03/2020 7:45 AM Performed by: Aline Brochure, CRNA Pre-anesthesia Checklist: Patient identified, Emergency Drugs available, Suction available and Patient being monitored Patient Re-evaluated:Patient Re-evaluated prior to induction Oxygen Delivery Method: Circle system utilized Preoxygenation: Pre-oxygenation with 100% oxygen Induction Type: IV induction Ventilation: Mask ventilation without difficulty Laryngoscope Size: McGraph and 4 Grade View: Grade I Tube type: Oral Tube size: 7.5 mm Number of attempts: 1 Airway Equipment and Method: Stylet and Video-laryngoscopy Placement Confirmation: ETT inserted through vocal cords under direct vision,  positive ETCO2 and breath sounds checked- equal and bilateral Secured at: 21 cm Tube secured with: Tape Dental Injury: Teeth and Oropharynx as per pre-operative assessment

## 2020-08-03 NOTE — Discharge Instructions (Addendum)

## 2020-08-03 NOTE — Transfer of Care (Signed)
Immediate Anesthesia Transfer of Care Note  Patient: Jeff Rice  Procedure(s) Performed: XI ROBOTIC ASSISTED INGUINAL HERNIA REPAIR WITH MESH, possible bilateral, converted to open procedure (Left )  Patient Location: PACU  Anesthesia Type:General  Level of Consciousness: sedated  Airway & Oxygen Therapy: Patient Spontanous Breathing and Patient connected to face mask oxygen  Post-op Assessment: Post -op Vital signs reviewed and stable  Post vital signs: stable  Last Vitals:  Vitals Value Taken Time  BP 110/49 08/03/20 1140  Temp 36.8 C 08/03/20 1140  Pulse 77 08/03/20 1144  Resp 12 08/03/20 1144  SpO2 97 % 08/03/20 1144  Vitals shown include unvalidated device data.  Last Pain:  Vitals:   08/03/20 0617  TempSrc: Oral  PainSc: 2          Complications: No complications documented.

## 2020-08-03 NOTE — Op Note (Signed)
Attempted Robotic assisted Laparoscopic  Inguinal  Open incisional hernia repair with mesh 6.4 cms ventralex Left open Inguinal hernia repair with mesh Open myofascial flaps measuring in the area of 150 cm      Pre-operative Diagnosis:  Recurrent chronically incarcerated left Inguinal Hernia and incisional hernia   Post-operative Diagnosis: Same   Surgeon: Caroleen Hamman, MD FACS   Anesthesia: Gen. with endotracheal tube   Findings:  Recurrent indirect inguinal hernia on the left, supraumbilical incisional hernia . Very complex repair requiring myofascial flap due to the concern of denuded peritoneum, recurrence.  There was a complete distortion of the left inguinal anatomy due to prior prostatectomy         Procedure Details  The patient was seen again in the Holding Room. The benefits, complications, treatment options, and expected outcomes were discussed with the patient. The risks of bleeding, infection, recurrence of symptoms, failure to resolve symptoms, recurrence of hernia, ischemic orchitis, chronic pain syndrome or neuroma, were discussed again. The likelihood of improving the patient's symptoms with return to their baseline status is good.  The patient and/or family concurred with the proposed plan, giving informed consent.  The patient was taken to Operating Room, identified  and the procedure verified as Laparoscopic Inguinal Hernia Repair. Laterality confirmed.  A Time Out was held and the above information confirmed.  Lee's note that the patient had a history of a prior left inguinal hernia repair as well as robotic prostatectomy.  Prior to the induction of general anesthesia, antibiotic prophylaxis was administered. VTE prophylaxis was in place. General endotracheal anesthesia was then administered and tolerated well. After the induction, the abdomen was prepped with Chloraprep and draped in the sterile fashion. The patient was positioned in the supine position.      Supraumbilical incision created and cut down technique used to enter the abdominal cavity. Incisional hernia was visualized, hernia sac sac dissected free from the abdominal wall, hernia measured 2 cms.  Fascia elevated and hasson trochar placed. Pneumoperitoneum obtained w/o HD changes. No evidence of bowel injuries.  Two 8 mm placed under direct vision. The laparoscopy revealed large indirect defects.   The robot was brought ot the table and docked in the standard fashion, no collision between arms was observed. Instruments were kept under direct view at all times.  There is no evidence of any injuries.  Inspection revealed the needed peritoneum on the left side from a prior robotic prostatectomy.  There was evidence of a left indirect inguinal hernia.  I was unable to fully reduce the lipoma of the cord.  I try manipulation from the inguinal area but I was unable to do that.  Because of more my concern of mesh being exposed to the peritoneum and not being able to fully reduce the hernia and do an appropriate repair robotically I chose to perform an open repair.  We were able to remove all the instruments and undocked the robot in the standard fashion. I scrubbed back in and concentrate my attention on the incisional hernia.  I was able to clean up the edges and place a 6.4 cm ventral Lex patch.  I secured this transabdominal in 4 corners in the standard fashion.  I was then able to close the defect with multiple 0 Ethibond sutures in the standard fashion. The robotic incisions were closed with 4-0 Monocryl's as well as the incisional hernia. Patient then was turned to the left inguinal area where an obvious large mass was seen and palpated.  I actually scrubbed out and confirm that this left inguinal mass was not an ectopic testicle.  He did have both of his testicle inside the scrotum they were hypotrophic but present.  Scrubbed back in and then performed the incision in the left inguinal area was  about 4 x 3 cm and was hard.  I was able to visualize the prior dissection from the robotic prostatectomy.  I was able to excise this chronically incarcerated lipoma and sent it for permanent pathology. Was then able to perform an incision in the external oblique muscle and created flaps.  The spermatic cord was protected at all times as well as the nerve.  I did note that the defect was lateral to the epigastric vessels.  There was evidence of prior repair.  I was able to get a good dissection in the retrorectus plane as well as the transversalis fascia.  I again visualize intra-abdominal cavity and confirmed that there was no evidence of good good posterior peritoneum.  For those reasons I elected to perform a modified repair.  I did a Shouldice tissue repairs approximated the conjoined tendon to the inguinal ligament.  At this point I felt that mesh reinforcement was also necessary therefore I perform a myocutaneous advancement flap making sure that I was able to detach the anterior fascia from the rectus abdominal muscle.  I created a good pocket to allow a good mesh to be placed.  The area measured 15 x 10 cm.  I was able to tailor the polypropylene mesh 3D max to that space and secure it to the pubic tubercle and the shelving edge of the inguinal ligament.  I was very happy with the repair and I closed the external oblique muscle with a running 2-0 Vicryl.  Skin incision was also closed with a running 4-0 Monocryl.  Liposomal Marcaine was injected at all incision sites.  Patient tolerated the procedure well. There were no complications. He was taken to the recovery room in stable condition.               Caroleen Hamman, MD, FACS

## 2020-08-04 ENCOUNTER — Encounter: Payer: Self-pay | Admitting: Surgery

## 2020-08-04 DIAGNOSIS — K432 Incisional hernia without obstruction or gangrene: Secondary | ICD-10-CM

## 2020-08-04 DIAGNOSIS — K4091 Unilateral inguinal hernia, without obstruction or gangrene, recurrent: Secondary | ICD-10-CM

## 2020-08-04 LAB — SURGICAL PATHOLOGY

## 2020-08-08 NOTE — Anesthesia Postprocedure Evaluation (Signed)
Anesthesia Post Note  Patient: Jeff Rice  Procedure(s) Performed: XI ROBOTIC ASSISTED INGUINAL HERNIA REPAIR WITH MESH, possible bilateral, converted to open procedure (Left )  Patient location during evaluation: PACU Anesthesia Type: General Level of consciousness: awake and alert and oriented Pain management: pain level controlled Vital Signs Assessment: post-procedure vital signs reviewed and stable Respiratory status: spontaneous breathing Cardiovascular status: blood pressure returned to baseline Anesthetic complications: no   No complications documented.   Last Vitals:  Vitals:   08/03/20 1230 08/03/20 1247  BP: (!) 125/55 126/60  Pulse: 82 78  Resp: 13 12  Temp: 36.7 C 36.6 C  SpO2: 96% 92%    Last Pain:  Vitals:   08/04/20 1421  TempSrc:   PainSc: 4                  Adwoa Axe

## 2020-08-15 ENCOUNTER — Other Ambulatory Visit: Payer: Self-pay

## 2020-08-16 ENCOUNTER — Encounter: Payer: Self-pay | Admitting: Surgery

## 2020-08-16 ENCOUNTER — Ambulatory Visit (INDEPENDENT_AMBULATORY_CARE_PROVIDER_SITE_OTHER): Payer: Managed Care, Other (non HMO) | Admitting: Surgery

## 2020-08-16 ENCOUNTER — Other Ambulatory Visit: Payer: Self-pay

## 2020-08-16 VITALS — BP 128/80 | HR 69 | Temp 97.9°F | Ht 68.0 in | Wt 214.0 lb

## 2020-08-16 DIAGNOSIS — Z09 Encounter for follow-up examination after completed treatment for conditions other than malignant neoplasm: Secondary | ICD-10-CM

## 2020-08-16 NOTE — Patient Instructions (Signed)
GENERAL POST-OPERATIVE °PATIENT INSTRUCTIONS  ° °WOUND CARE INSTRUCTIONS:  Keep a dry clean dressing on the wound if there is drainage. The initial bandage may be removed after 24 hours.  Once the wound has quit draining you may leave it open to air.  If clothing rubs against the wound or causes irritation and the wound is not draining you may cover it with a dry dressing during the daytime.  Try to keep the wound dry and avoid ointments on the wound unless directed to do so.  If the wound becomes bright red and painful or starts to drain infected material that is not clear, please contact your physician immediately.  If the wound is mildly pink and has a thick firm ridge underneath it, this is normal, and is referred to as a healing ridge.  This will resolve over the next 4-6 weeks. ° °BATHING: °You may shower if you have been informed of this by your surgeon. However, Please do not submerge in a tub, hot tub, or pool until incisions are completely sealed or have been told by your surgeon that you may do so. ° °DIET:  You may eat any foods that you can tolerate.  It is a good idea to eat a high fiber diet and take in plenty of fluids to prevent constipation.  If you do become constipated you may want to take a mild laxative or take ducolax tablets on a daily basis until your bowel habits are regular.  Constipation can be very uncomfortable, along with straining, after recent surgery. ° °ACTIVITY:  You are encouraged to cough and deep breath or use your incentive spirometer if you were given one, every 15-30 minutes when awake.  This will help prevent respiratory complications and low grade fevers post-operatively if you had a general anesthetic.  You may want to hug a pillow when coughing and sneezing to add additional support to the surgical area, if you had abdominal or chest surgery, which will decrease pain during these times.  You are encouraged to walk and engage in light activity for the next two weeks.  You  should not lift more than 20 pounds for 6 weeks after surgery as it could put you at increased risk for complications.  Twenty pounds is roughly equivalent to a plastic bag of groceries. At that time- Listen to your body when lifting, if you have pain when lifting, stop and then try again in a few days. Soreness after doing exercises or activities of daily living is normal as you get back in to your normal routine. ° °MEDICATIONS:  Try to take narcotic medications and anti-inflammatory medications, such as tylenol, ibuprofen, naprosyn, etc., with food.  This will minimize stomach upset from the medication.  Should you develop nausea and vomiting from the pain medication, or develop a rash, please discontinue the medication and contact your physician.  You should not drive, make important decisions, or operate machinery when taking narcotic pain medication. ° °SUNBLOCK °Use sun block to incision area over the next year if this area will be exposed to sun. This helps decrease scarring and will allow you avoid a permanent darkened area over your incision. ° °QUESTIONS:  Please feel free to call our office if you have any questions, and we will be glad to assist you.  ° ° °

## 2020-08-16 NOTE — Progress Notes (Signed)
Jeff Rice is 55 year old 2 weeks out for a left recurrent open inguinal hernia repair as well as a ventral hernia repair.  Jeff Rice is doing very well.  Some soreness.  No fevers no chills taking p.o. Jeff Rice is tolerating diet with normal bowel movements.  Jeff Rice is ambulating  PE NAD Abd: soft, patient is healing well without evidence of infection.  There is no evidence of recurrence.. No evidence of necrotizing infection or peritonitis   A/P doing very well after hernia repairs x2.  No surgical complication.  Avoid any heavy lifting.  RTC as needed

## 2020-08-30 ENCOUNTER — Telehealth: Payer: Self-pay | Admitting: Surgery

## 2020-08-30 NOTE — Telephone Encounter (Signed)
Patient is calling and is asking if he is able to go swimming or smerge in water, patient had a  bilateral inguinal hernia repair. Please call patient and advise.

## 2020-08-30 NOTE — Telephone Encounter (Signed)
Spoke with pt and advised him that if his incisions are completely closed and healed, it would be fine to submerge in bathtub. Pt verbalized understanding.

## 2020-11-24 ENCOUNTER — Encounter: Payer: Self-pay | Admitting: Urology

## 2021-01-07 ENCOUNTER — Encounter: Payer: Self-pay | Admitting: Urology

## 2021-01-09 MED ORDER — NONFORMULARY OR COMPOUNDED ITEM: 6 refills | Status: DC

## 2021-01-09 MED ORDER — NONFORMULARY OR COMPOUNDED ITEM: 6 refills | Status: AC

## 2021-01-09 NOTE — Addendum Note (Signed)
Addended by: Verlene Mayer A on: 01/09/2021 04:18 PM   Modules accepted: Orders

## 2021-02-21 ENCOUNTER — Ambulatory Visit: Payer: Self-pay | Admitting: Urology

## 2021-03-19 IMAGING — CR DG CHEST 2V
1 series · 2 of 2 positions shown · non-contrast
Comparison: Radiograph 09/18/2015, chest CT 09/04/2008

CLINICAL DATA: Cough and fever.  COVID exposure.

EXAM:
CHEST - 2 VIEW

[Series 1: dg chest 2 view · 0.14mm/px · 2 of 2 slices shown]
[im 1/2]
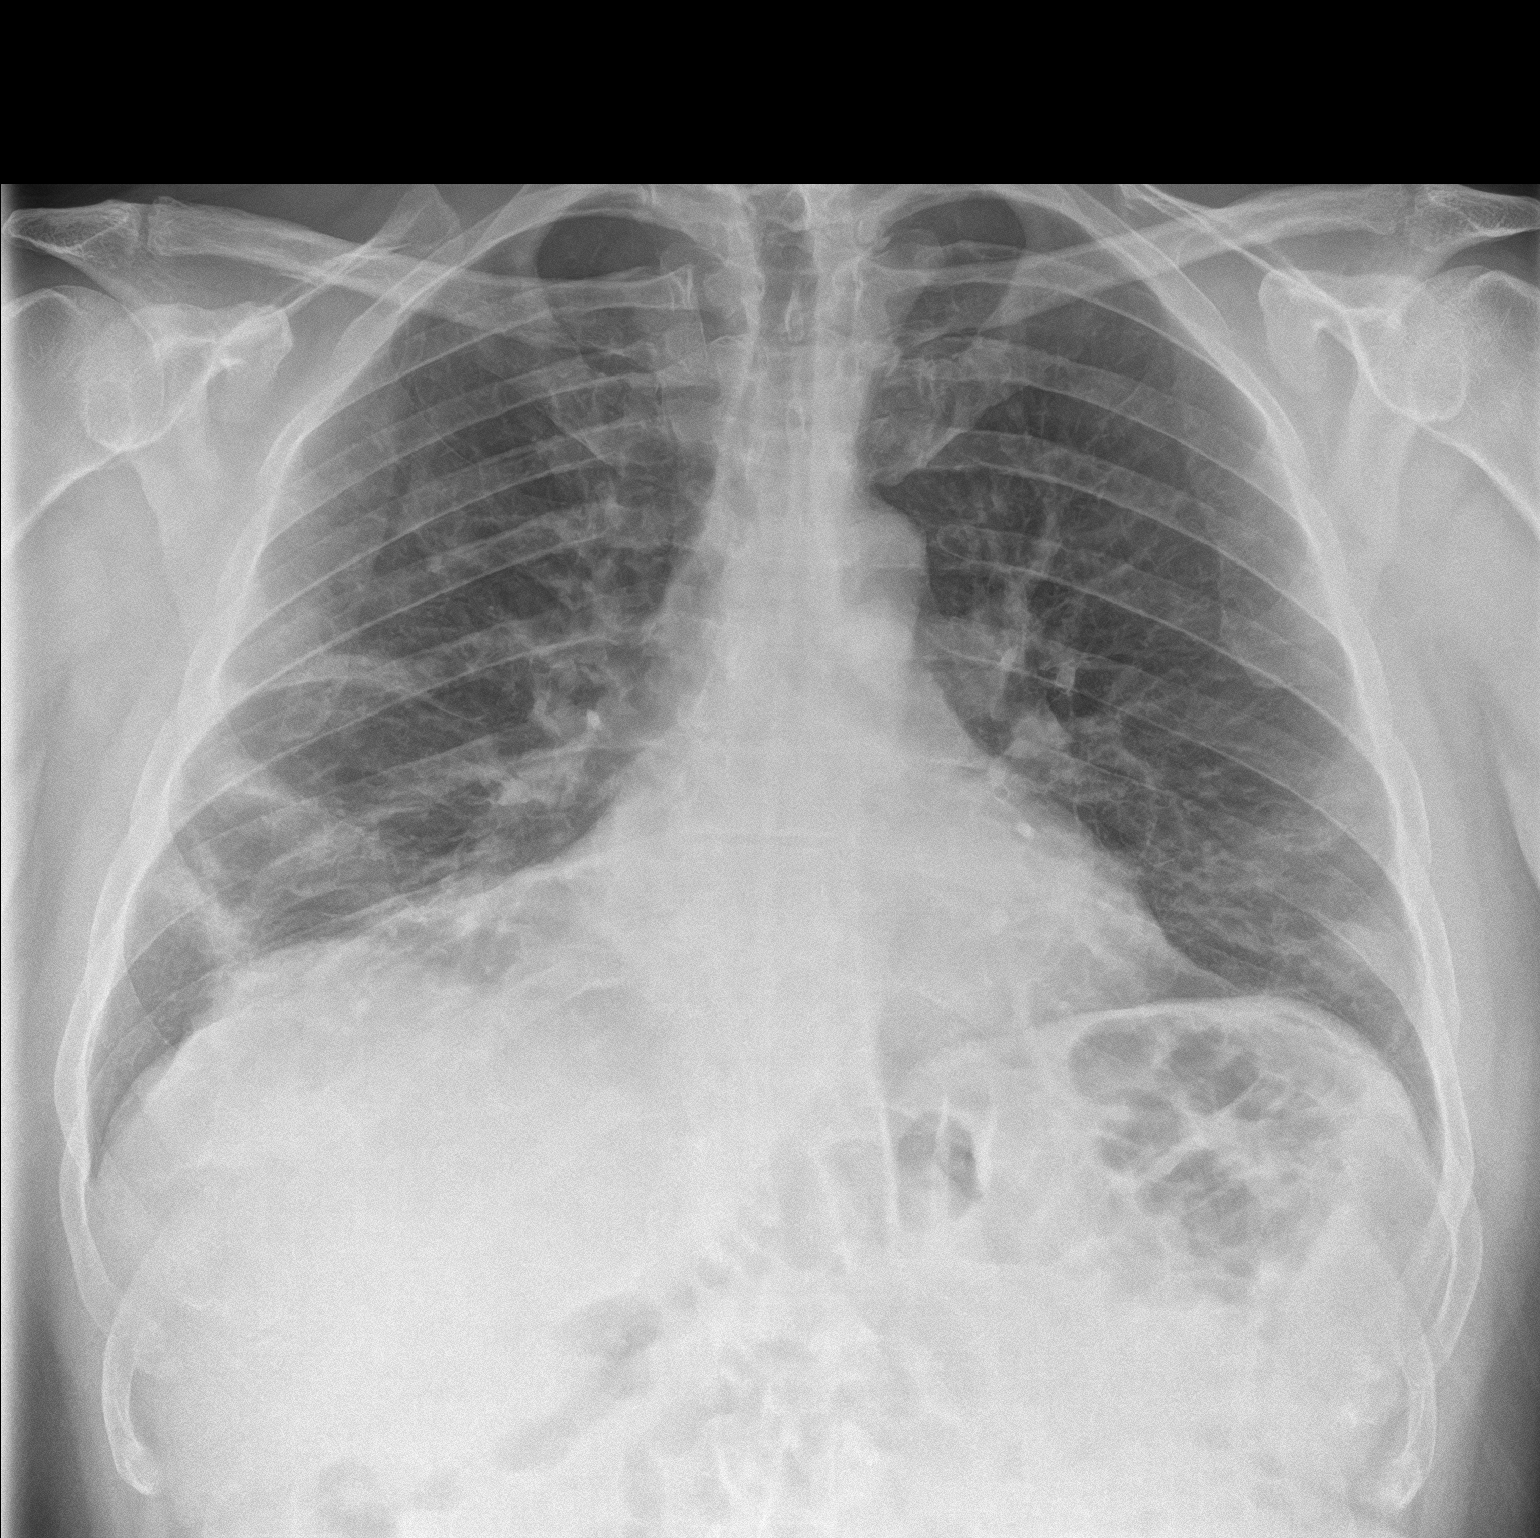
[im 2/2]
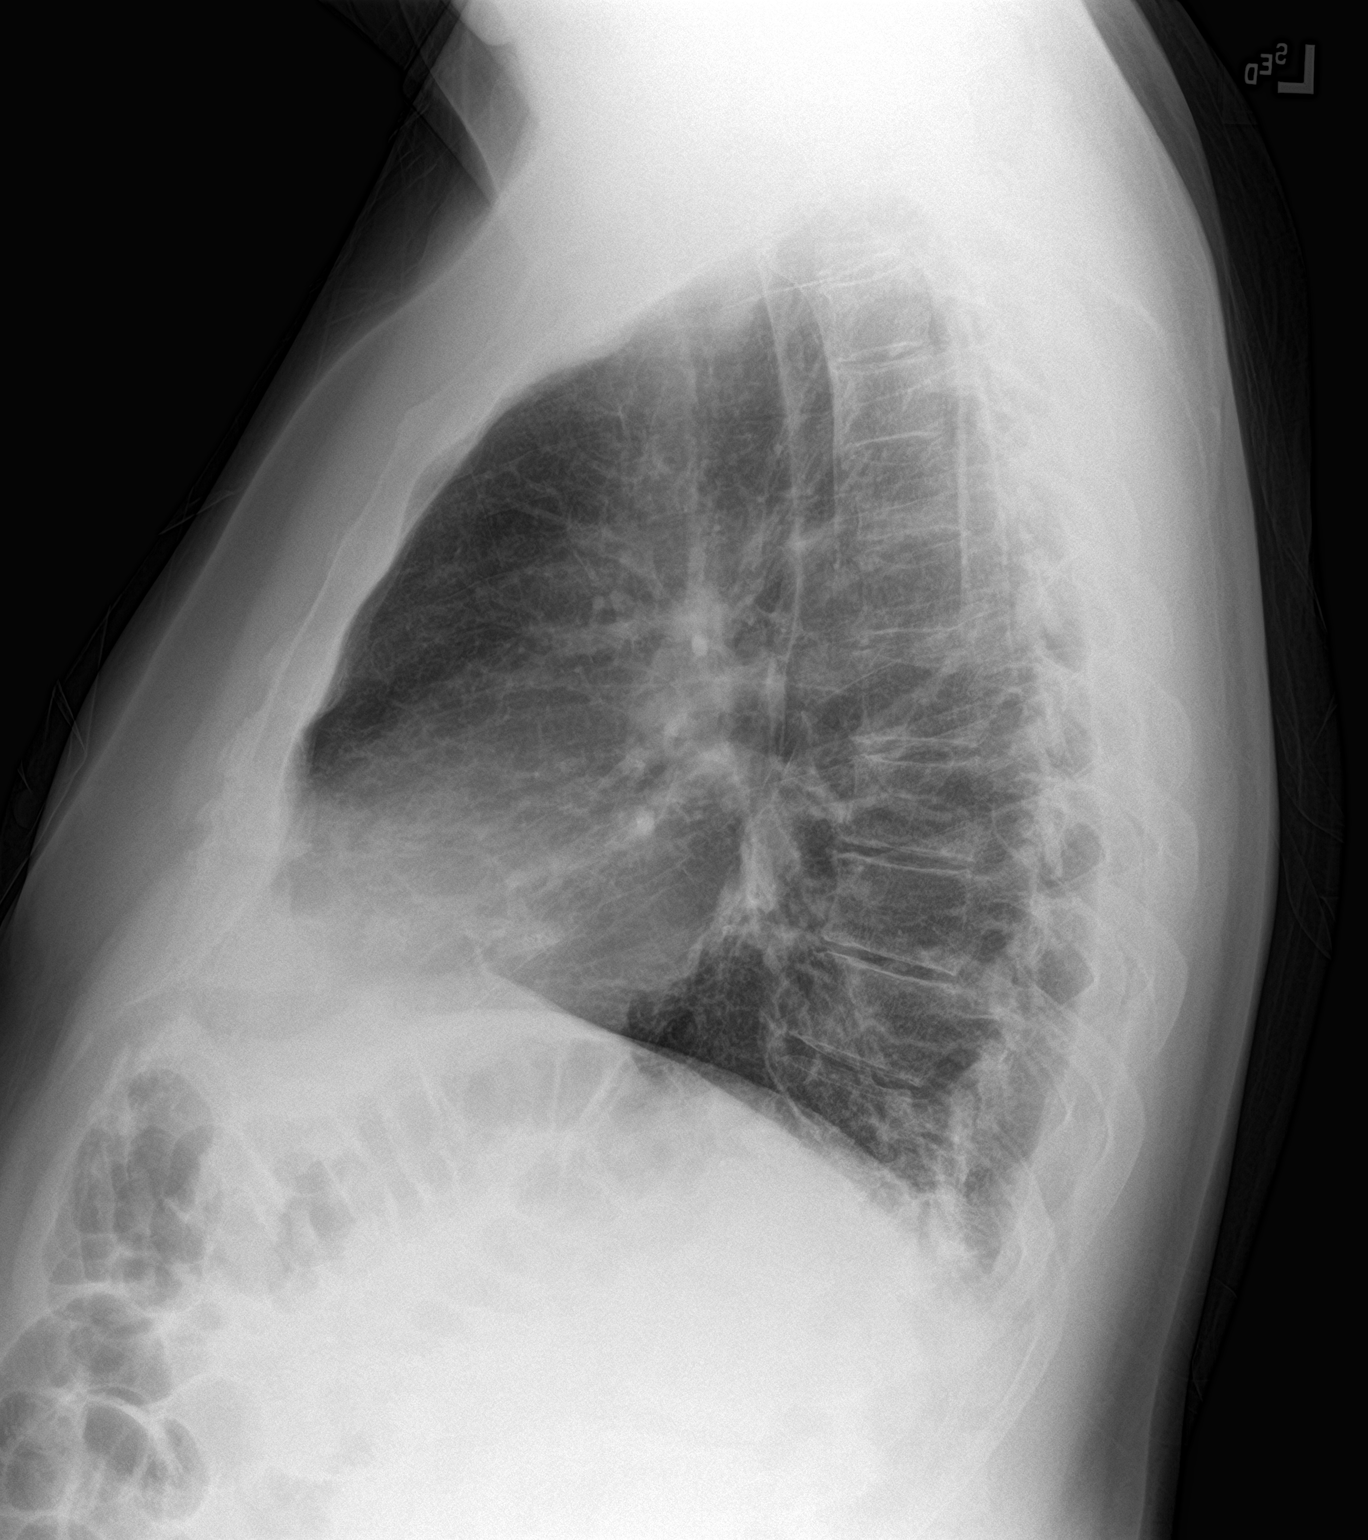

[2 of 2 positions shown; findings below may reference images not displayed]

FINDINGS: Patchy heterogeneous bilateral lung opacities in a mid lower lung
zone predominant distribution. Heart is normal in size. Unchanged
mediastinal contours. Opacity abutting the right heart border
corresponds to a prominent epicardial fat pad on prior chest CT. No
pleural fluid or pneumothorax. No acute osseous abnormalities are
seen.
IMPRESSION: Patchy heterogeneous bilateral lung opacities in a mid lower lung
zone predominant distribution, suspicious for multifocal pneumonia,
pattern typical of K1D0O-[id number] pneumonia.

## 2022-04-12 LAB — EXTERNAL GENERIC LAB PROCEDURE: COLOGUARD: NEGATIVE

## 2022-04-12 LAB — COLOGUARD: COLOGUARD: NEGATIVE

## 2024-04-28 ENCOUNTER — Ambulatory Visit (INDEPENDENT_AMBULATORY_CARE_PROVIDER_SITE_OTHER): Admitting: Urology

## 2024-04-28 ENCOUNTER — Encounter: Payer: Self-pay | Admitting: Urology

## 2024-04-28 VITALS — BP 141/86 | HR 74 | Ht 68.0 in | Wt 238.0 lb

## 2024-04-28 DIAGNOSIS — N5231 Erectile dysfunction following radical prostatectomy: Secondary | ICD-10-CM | POA: Diagnosis not present

## 2024-04-28 DIAGNOSIS — C61 Malignant neoplasm of prostate: Secondary | ICD-10-CM | POA: Diagnosis not present

## 2024-04-28 DIAGNOSIS — N393 Stress incontinence (female) (male): Secondary | ICD-10-CM | POA: Diagnosis not present

## 2024-04-28 LAB — URINALYSIS, ROUTINE W REFLEX MICROSCOPIC
Bilirubin, UA: NEGATIVE
Glucose, UA: NEGATIVE
Ketones, UA: NEGATIVE
Leukocytes,UA: NEGATIVE
Nitrite, UA: NEGATIVE
Protein,UA: NEGATIVE
RBC, UA: NEGATIVE
Specific Gravity, UA: 1.015 (ref 1.005–1.030)
Urobilinogen, Ur: 0.2 mg/dL (ref 0.2–1.0)
pH, UA: 7 (ref 5.0–7.5)

## 2024-04-28 MED ORDER — TADALAFIL 20 MG PO TABS
20.0000 mg | ORAL_TABLET | Freq: Every day | ORAL | 11 refills | Status: AC | PRN
Start: 2024-04-28 — End: ?

## 2024-04-28 NOTE — Progress Notes (Signed)
 Assessment: 1. Prostate cancer (HCC); pT2Nx; GG2; s/p RALP 2/21   2. Erectile dysfunction after radical prostatectomy   3. Male stress incontinence     Plan: I personally reviewed the patient's chart including provider notes, lab results. I reviewed notes from [facility] as well as [facility]. Prescription for tadalafil  20 mg as needed provided. Return to office in 6 months with PSA.  Chief Complaint:  Chief Complaint  Patient presents with   Prostate Cancer    History of Present Illness:  Jeff Rice is a 58 y.o. male who is seen for evaluation of prostate cancer and erectile dysfunction. He is status post RALP for pT2N0 Gleason 3+4 adenocarcinoma in February 2021. His PSA has been undetectable postoperatively. PSA 11/25: <0.01  He has had some urinary incontinence requiring a daily pad.  He has not done pelvic floor exercises for some time.  He is not particularly bothered by the incontinence. He voids with a good stream.  No dysuria or gross hematuria.  He has erectile dysfunction.  This was present prior to surgery but has worsened postoperatively.  He has been using Trimix since 2022.  He has had good results with 0.5 mL of the 10/30/1 mixture.  He has not used the injection for approximately 1 year.  He is interested in retrying oral medication.   Past Medical History:  Past Medical History:  Diagnosis Date   BPH (benign prostatic hyperplasia)    ED (erectile dysfunction)    History of kidney stones    HLD (hyperlipidemia)    Hypertension    Inguinal hernia    PONV (postoperative nausea and vomiting) 2002   with hernia surgery only   Rising PSA level     Past Surgical History:  Past Surgical History:  Procedure Laterality Date   COLONOSCOPY  2018   HERNIA REPAIR Left 2002   INGUINAL   ROBOT ASSISTED LAPAROSCOPIC RADICAL PROSTATECTOMY N/A 07/05/2019   Procedure: XI ROBOTIC ASSISTED LAPAROSCOPIC RADICAL PROSTATECTOMY WITH  LYMPH NODE DISSECTION;  Surgeon: Penne Knee, MD;  Location: [facility];  Service: Urology;  Laterality: N/A;   salivary gland excision  1990's   XI ROBOTIC ASSISTED INGUINAL HERNIA REPAIR WITH MESH Left 08/03/2020   Procedure: XI ROBOTIC ASSISTED INGUINAL HERNIA REPAIR WITH MESH, possible bilateral, converted to open procedure;  Surgeon: Jordis Laneta FALCON, MD;  Location: [facility];  Service: General;  Laterality: Left;    Allergies:  Allergies  Allergen Reactions   Tape Dermatitis and Rash    DO NOT USE Paper tape, developed contact dermatitis with blistering rash     Family History:  Family History  Problem Relation Age of Onset   Benign prostatic hyperplasia Father    Heart disease Father    Diabetes Mellitus II Father    Colon cancer Father    Throat cancer Paternal Grandfather    Prostate cancer Paternal Grandfather    Heart disease Brother    Bladder Cancer Neg Hx    Kidney cancer Neg Hx     Social History:  Social History   Tobacco Use   Smoking status: Never   Smokeless tobacco: Never  Vaping Use   Vaping status: Never Used  Substance Use Topics   Alcohol use: No    Alcohol/week: 0.0 standard drinks of alcohol   Drug use: No    Review of symptoms:  Constitutional:  Negative for unexplained weight loss, night sweats, fever, chills ENT:  Negative for nose bleeds, sinus pain, painful  swallowing CV:  Negative for chest pain, shortness of breath, exercise intolerance, palpitations, loss of consciousness Resp:  Negative for cough, wheezing, shortness of breath GI:  Negative for nausea, vomiting, diarrhea, bloody stools GU:  Positives noted in HPI; otherwise negative for gross hematuria, dysuria Neuro:  Negative for seizures, poor balance, limb weakness, slurred speech Psych:  Negative for lack of energy, depression, anxiety Endocrine:  Negative for polydipsia, polyuria, symptoms of hypoglycemia (dizziness, hunger, sweating) Hematologic:  Negative for anemia, purpura,  petechia, prolonged or excessive bleeding, use of anticoagulants  Allergic:  Negative for difficulty breathing or choking as a result of exposure to anything; no shellfish allergy; no allergic response (rash/itch) to materials, foods  Physical exam: BP (!) 141/86   Pulse 74   Ht 5' 8 (1.727 m)   Wt 238 lb (108 kg)   BMI 36.19 kg/m  GENERAL APPEARANCE:  Well appearing, well developed, well nourished, NAD HEENT: Atraumatic, Normocephalic, oropharynx clear. NECK: Supple without lymphadenopathy or thyromegaly. LUNGS: Clear to auscultation bilaterally. HEART: Regular Rate and Rhythm without murmurs, gallops, or rubs. ABDOMEN: Soft, non-tender, No Masses. EXTREMITIES: Moves all extremities well.  Without clubbing, cyanosis, or edema. NEUROLOGIC:  Alert and oriented x 3, normal gait, CN II-XII grossly intact.  MENTAL STATUS:  Appropriate. BACK:  Non-tender to palpation.  No CVAT SKIN:  Warm, dry and intact.    Results: U/A: negative

## 2024-10-28 ENCOUNTER — Ambulatory Visit: Admitting: Urology
